# Patient Record
Sex: Female | Born: 1965 | Race: Black or African American | Hispanic: No | Smoking: Never smoker
Health system: Southern US, Community
[De-identification: ages and names within clinical notes are randomized; demographics above are authoritative.]

## PROBLEM LIST (undated history)

## (undated) DIAGNOSIS — E669 Obesity, unspecified: Secondary | ICD-10-CM

## (undated) DIAGNOSIS — I10 Essential (primary) hypertension: Secondary | ICD-10-CM

## (undated) DIAGNOSIS — E119 Type 2 diabetes mellitus without complications: Secondary | ICD-10-CM

## (undated) DIAGNOSIS — K449 Diaphragmatic hernia without obstruction or gangrene: Secondary | ICD-10-CM

## (undated) DIAGNOSIS — I4891 Unspecified atrial fibrillation: Secondary | ICD-10-CM

## (undated) HISTORY — DX: Obesity, unspecified: E66.9

## (undated) HISTORY — DX: Diaphragmatic hernia without obstruction or gangrene: K44.9

## (undated) HISTORY — DX: Unspecified atrial fibrillation: I48.91

---

## 1993-07-12 HISTORY — PX: ABDOMINAL HYSTERECTOMY: SHX81

## 2000-07-12 HISTORY — PX: CHOLECYSTECTOMY: SHX55

## 2003-10-01 ENCOUNTER — Other Ambulatory Visit: Admission: RE | Admit: 2003-10-01 | Discharge: 2003-10-01 | Payer: Self-pay | Admitting: Obstetrics and Gynecology

## 2004-06-09 ENCOUNTER — Ambulatory Visit: Payer: Self-pay | Admitting: Family Medicine

## 2004-06-10 ENCOUNTER — Ambulatory Visit (HOSPITAL_COMMUNITY): Admission: RE | Admit: 2004-06-10 | Discharge: 2004-06-10 | Payer: Self-pay | Admitting: Family Medicine

## 2004-06-11 HISTORY — PX: OTHER SURGICAL HISTORY: SHX169

## 2004-06-28 ENCOUNTER — Ambulatory Visit: Admission: RE | Admit: 2004-06-28 | Discharge: 2004-06-28 | Payer: Self-pay | Admitting: Family Medicine

## 2004-06-28 ENCOUNTER — Ambulatory Visit: Payer: Self-pay | Admitting: Pulmonary Disease

## 2004-07-09 ENCOUNTER — Ambulatory Visit: Payer: Self-pay | Admitting: Family Medicine

## 2004-08-10 ENCOUNTER — Ambulatory Visit: Payer: Self-pay | Admitting: Family Medicine

## 2005-05-28 ENCOUNTER — Ambulatory Visit: Payer: Self-pay | Admitting: Family Medicine

## 2005-05-31 ENCOUNTER — Ambulatory Visit (HOSPITAL_COMMUNITY): Admission: RE | Admit: 2005-05-31 | Discharge: 2005-05-31 | Payer: Self-pay | Admitting: Family Medicine

## 2005-08-04 ENCOUNTER — Ambulatory Visit: Payer: Self-pay | Admitting: Family Medicine

## 2005-10-04 ENCOUNTER — Ambulatory Visit: Payer: Self-pay | Admitting: Family Medicine

## 2005-10-26 ENCOUNTER — Ambulatory Visit: Payer: Self-pay | Admitting: Family Medicine

## 2005-11-05 ENCOUNTER — Ambulatory Visit: Payer: Self-pay | Admitting: Family Medicine

## 2005-11-17 ENCOUNTER — Other Ambulatory Visit: Admission: RE | Admit: 2005-11-17 | Discharge: 2005-11-17 | Payer: Self-pay | Admitting: Family Medicine

## 2005-11-17 ENCOUNTER — Ambulatory Visit: Payer: Self-pay | Admitting: Family Medicine

## 2005-11-17 LAB — CONVERTED CEMR LAB: Pap Smear: NORMAL

## 2006-04-29 ENCOUNTER — Ambulatory Visit: Payer: Self-pay | Admitting: Family Medicine

## 2006-06-14 ENCOUNTER — Ambulatory Visit (HOSPITAL_COMMUNITY): Admission: RE | Admit: 2006-06-14 | Discharge: 2006-06-14 | Payer: Self-pay | Admitting: Family Medicine

## 2006-08-23 ENCOUNTER — Ambulatory Visit: Payer: Self-pay | Admitting: Family Medicine

## 2006-08-25 ENCOUNTER — Encounter: Payer: Self-pay | Admitting: Family Medicine

## 2006-08-25 ENCOUNTER — Encounter (INDEPENDENT_AMBULATORY_CARE_PROVIDER_SITE_OTHER): Payer: Self-pay | Admitting: *Deleted

## 2006-08-25 ENCOUNTER — Other Ambulatory Visit: Admission: RE | Admit: 2006-08-25 | Discharge: 2006-08-25 | Payer: Self-pay | Admitting: General Surgery

## 2006-08-25 LAB — CONVERTED CEMR LAB
Basophils Absolute: 0 10*3/uL (ref 0.0–0.1)
Basophils Relative: 0 % (ref 0–1)
Eosinophils Absolute: 0.1 10*3/uL (ref 0.0–0.7)
Eosinophils Relative: 1 % (ref 0–5)
HCT: 37.7 % (ref 36.0–46.0)
Hemoglobin: 12.1 g/dL (ref 12.0–15.0)
Hgb A1c MFr Bld: 6.9 % — ABNORMAL HIGH (ref 4.6–6.1)
Lymphocytes Relative: 18 % (ref 12–46)
Lymphs Abs: 2.5 10*3/uL (ref 0.7–3.3)
MCHC: 32.1 g/dL (ref 30.0–36.0)
MCV: 82.1 fL (ref 78.0–100.0)
Monocytes Absolute: 1.6 10*3/uL — ABNORMAL HIGH (ref 0.2–0.7)
Monocytes Relative: 12 % — ABNORMAL HIGH (ref 3–11)
Neutro Abs: 9.5 10*3/uL — ABNORMAL HIGH (ref 1.7–7.7)
Neutrophils Relative %: 69 % (ref 43–77)
Platelets: 347 10*3/uL (ref 150–400)
RBC: 4.59 M/uL (ref 3.87–5.11)
RDW: 14.4 % — ABNORMAL HIGH (ref 11.5–14.0)
WBC: 13.7 10*3/uL — ABNORMAL HIGH (ref 4.0–10.5)

## 2006-09-14 ENCOUNTER — Ambulatory Visit: Payer: Self-pay | Admitting: Family Medicine

## 2006-11-28 ENCOUNTER — Encounter: Payer: Self-pay | Admitting: Family Medicine

## 2006-11-28 LAB — CONVERTED CEMR LAB
BUN: 14 mg/dL (ref 6–23)
CO2: 25 meq/L (ref 19–32)
Calcium: 9.6 mg/dL (ref 8.4–10.5)
Chloride: 105 meq/L (ref 96–112)
Cholesterol: 170 mg/dL (ref 0–200)
Creatinine, Ser: 0.82 mg/dL (ref 0.40–1.20)
Glucose, Bld: 103 mg/dL — ABNORMAL HIGH (ref 70–99)
HDL: 51 mg/dL (ref >39)
Hgb A1c MFr Bld: 6.9 % — ABNORMAL HIGH (ref 4.6–6.1)
LDL Cholesterol: 96 mg/dL (ref 0–99)
Potassium: 4.6 meq/L (ref 3.5–5.3)
Sodium: 139 meq/L (ref 135–145)
Total CHOL/HDL Ratio: 3.3
Triglycerides: 117 mg/dL (ref <150)
VLDL: 23 mg/dL (ref 0–40)

## 2006-11-29 ENCOUNTER — Ambulatory Visit: Payer: Self-pay | Admitting: Family Medicine

## 2007-01-26 ENCOUNTER — Encounter: Payer: Self-pay | Admitting: Family Medicine

## 2007-01-26 ENCOUNTER — Ambulatory Visit: Payer: Self-pay | Admitting: Family Medicine

## 2007-01-26 ENCOUNTER — Other Ambulatory Visit: Admission: RE | Admit: 2007-01-26 | Discharge: 2007-01-26 | Payer: Self-pay | Admitting: Family Medicine

## 2007-01-26 LAB — CONVERTED CEMR LAB
Chlamydia, DNA Probe: NEGATIVE
GC Probe Amp, Genital: NEGATIVE

## 2007-01-27 ENCOUNTER — Encounter: Payer: Self-pay | Admitting: Family Medicine

## 2007-01-27 LAB — CONVERTED CEMR LAB
Candida species: NEGATIVE
Gardnerella vaginalis: POSITIVE — AB
Trichomonal Vaginitis: NEGATIVE

## 2007-02-09 ENCOUNTER — Ambulatory Visit: Payer: Self-pay | Admitting: Family Medicine

## 2007-03-23 ENCOUNTER — Emergency Department (HOSPITAL_COMMUNITY): Admission: EM | Admit: 2007-03-23 | Discharge: 2007-03-23 | Payer: Self-pay | Admitting: Emergency Medicine

## 2007-03-29 ENCOUNTER — Ambulatory Visit: Payer: Self-pay | Admitting: Family Medicine

## 2007-04-12 HISTORY — PX: TRANSTHORACIC ECHOCARDIOGRAM: SHX275

## 2007-04-12 HISTORY — PX: NM MYOCAR PERF WALL MOTION: HXRAD629

## 2007-04-24 ENCOUNTER — Ambulatory Visit: Payer: Self-pay | Admitting: Family Medicine

## 2007-04-25 ENCOUNTER — Encounter: Payer: Self-pay | Admitting: Family Medicine

## 2007-04-25 LAB — CONVERTED CEMR LAB
Candida species: NEGATIVE
Chlamydia, DNA Probe: NEGATIVE
GC Probe Amp, Genital: NEGATIVE
Gardnerella vaginalis: POSITIVE — AB
Trichomonal Vaginitis: NEGATIVE

## 2007-06-19 ENCOUNTER — Ambulatory Visit (HOSPITAL_COMMUNITY): Admission: RE | Admit: 2007-06-19 | Discharge: 2007-06-19 | Payer: Self-pay | Admitting: Family Medicine

## 2007-06-19 ENCOUNTER — Encounter: Payer: Self-pay | Admitting: Family Medicine

## 2007-06-19 LAB — CONVERTED CEMR LAB: Pap Smear: NORMAL

## 2008-03-08 ENCOUNTER — Encounter: Payer: Self-pay | Admitting: Family Medicine

## 2008-03-08 DIAGNOSIS — E119 Type 2 diabetes mellitus without complications: Secondary | ICD-10-CM | POA: Insufficient documentation

## 2008-03-08 DIAGNOSIS — I10 Essential (primary) hypertension: Secondary | ICD-10-CM | POA: Insufficient documentation

## 2008-03-12 ENCOUNTER — Ambulatory Visit: Payer: Self-pay | Admitting: Family Medicine

## 2008-03-21 ENCOUNTER — Telehealth: Payer: Self-pay | Admitting: Family Medicine

## 2008-03-25 ENCOUNTER — Telehealth: Payer: Self-pay | Admitting: Family Medicine

## 2008-06-26 ENCOUNTER — Ambulatory Visit (HOSPITAL_COMMUNITY): Admission: RE | Admit: 2008-06-26 | Discharge: 2008-06-26 | Payer: Self-pay | Admitting: Family Medicine

## 2008-08-06 ENCOUNTER — Ambulatory Visit: Payer: Self-pay | Admitting: Family Medicine

## 2008-08-06 LAB — CONVERTED CEMR LAB
Glucose, Bld: 119 mg/dL
Hgb A1c MFr Bld: 7.3 %
Rapid Strep: NEGATIVE

## 2008-09-24 ENCOUNTER — Encounter: Payer: Self-pay | Admitting: Family Medicine

## 2008-09-24 LAB — CONVERTED CEMR LAB
ALT: 15 units/L (ref 0–35)
AST: 12 units/L (ref 0–37)
Albumin: 4.1 g/dL (ref 3.5–5.2)
Alkaline Phosphatase: 66 units/L (ref 39–117)
BUN: 13 mg/dL (ref 6–23)
Basophils Absolute: 0 10*3/uL (ref 0.0–0.1)
Basophils Relative: 0 % (ref 0–1)
Bilirubin, Direct: 0.1 mg/dL (ref 0.0–0.3)
CO2: 26 meq/L (ref 19–32)
Calcium: 9.7 mg/dL (ref 8.4–10.5)
Chloride: 100 meq/L (ref 96–112)
Cholesterol: 188 mg/dL (ref 0–200)
Creatinine, Ser: 0.78 mg/dL (ref 0.40–1.20)
Eosinophils Absolute: 0.3 10*3/uL (ref 0.0–0.7)
Eosinophils Relative: 5 % (ref 0–5)
Glucose, Bld: 141 mg/dL — ABNORMAL HIGH (ref 70–99)
HCT: 40.8 % (ref 36.0–46.0)
HDL: 46 mg/dL (ref >39)
Hemoglobin: 13.1 g/dL (ref 12.0–15.0)
Indirect Bilirubin: 0.3 mg/dL (ref 0.0–0.9)
LDL Cholesterol: 105 mg/dL — ABNORMAL HIGH (ref 0–99)
Lymphocytes Relative: 28 % (ref 12–46)
Lymphs Abs: 1.7 10*3/uL (ref 0.7–4.0)
MCHC: 32.1 g/dL (ref 30.0–36.0)
MCV: 81.4 fL (ref 78.0–100.0)
Monocytes Absolute: 0.6 10*3/uL (ref 0.1–1.0)
Monocytes Relative: 9 % (ref 3–12)
Neutro Abs: 3.6 10*3/uL (ref 1.7–7.7)
Neutrophils Relative %: 58 % (ref 43–77)
Platelets: 359 10*3/uL (ref 150–400)
Potassium: 4.9 meq/L (ref 3.5–5.3)
RBC: 5.01 M/uL (ref 3.87–5.11)
RDW: 14.2 % (ref 11.5–15.5)
Sodium: 136 meq/L (ref 135–145)
TSH: 4.039 microintl units/mL (ref 0.350–4.500)
Total Bilirubin: 0.4 mg/dL (ref 0.3–1.2)
Total CHOL/HDL Ratio: 4.1
Total Protein: 7.8 g/dL (ref 6.0–8.3)
Triglycerides: 185 mg/dL — ABNORMAL HIGH (ref <150)
VLDL: 37 mg/dL (ref 0–40)
WBC: 6.3 10*3/uL (ref 4.0–10.5)

## 2008-10-08 ENCOUNTER — Ambulatory Visit: Payer: Self-pay | Admitting: Family Medicine

## 2008-10-08 ENCOUNTER — Other Ambulatory Visit: Admission: RE | Admit: 2008-10-08 | Discharge: 2008-10-08 | Payer: Self-pay | Admitting: Family Medicine

## 2008-10-08 ENCOUNTER — Encounter: Payer: Self-pay | Admitting: Family Medicine

## 2008-10-08 LAB — CONVERTED CEMR LAB
Blood Glucose, Fasting: 125 mg/dL
OCCULT 1: NEGATIVE

## 2008-10-09 ENCOUNTER — Encounter: Payer: Self-pay | Admitting: Family Medicine

## 2008-10-09 LAB — CONVERTED CEMR LAB
Creatinine, Urine: 96 mg/dL
Microalb Creat Ratio: 5.2 mg/g (ref 0.0–30.0)
Microalb, Ur: 0.5 mg/dL (ref 0.00–1.89)

## 2008-10-13 DIAGNOSIS — E785 Hyperlipidemia, unspecified: Secondary | ICD-10-CM | POA: Insufficient documentation

## 2008-10-31 ENCOUNTER — Ambulatory Visit: Payer: Self-pay | Admitting: Family Medicine

## 2008-10-31 LAB — CONVERTED CEMR LAB
Glucose, Bld: 103 mg/dL
Hgb A1c MFr Bld: 7.2 %

## 2008-11-07 ENCOUNTER — Encounter: Payer: Self-pay | Admitting: Family Medicine

## 2008-11-07 LAB — CONVERTED CEMR LAB
Basophils Absolute: 0 10*3/uL (ref 0.0–0.1)
Basophils Relative: 0 % (ref 0–1)
Eosinophils Absolute: 0.3 10*3/uL (ref 0.0–0.7)
Eosinophils Relative: 5 % (ref 0–5)
HCT: 39.7 % (ref 36.0–46.0)
Hemoglobin: 12.7 g/dL (ref 12.0–15.0)
Lymphocytes Relative: 31 % (ref 12–46)
Lymphs Abs: 2.2 10*3/uL (ref 0.7–4.0)
MCHC: 32 g/dL (ref 30.0–36.0)
MCV: 79.9 fL (ref 78.0–100.0)
Monocytes Absolute: 0.6 10*3/uL (ref 0.1–1.0)
Monocytes Relative: 8 % (ref 3–12)
Neutro Abs: 4.1 10*3/uL (ref 1.7–7.7)
Neutrophils Relative %: 57 % (ref 43–77)
Platelets: 389 10*3/uL (ref 150–400)
RBC: 4.97 M/uL (ref 3.87–5.11)
RDW: 14.1 % (ref 11.5–15.5)
WBC: 7.3 10*3/uL (ref 4.0–10.5)

## 2008-11-12 ENCOUNTER — Encounter: Payer: Self-pay | Admitting: Family Medicine

## 2008-12-06 ENCOUNTER — Ambulatory Visit: Payer: Self-pay | Admitting: Family Medicine

## 2008-12-06 DIAGNOSIS — M25559 Pain in unspecified hip: Secondary | ICD-10-CM

## 2008-12-06 LAB — CONVERTED CEMR LAB: Blood Glucose, Fasting: 138 mg/dL

## 2009-03-07 ENCOUNTER — Ambulatory Visit: Payer: Self-pay | Admitting: Family Medicine

## 2009-03-07 LAB — CONVERTED CEMR LAB
Glucose, Bld: 130 mg/dL
Hgb A1c MFr Bld: 7.2 %

## 2009-06-30 ENCOUNTER — Ambulatory Visit (HOSPITAL_COMMUNITY): Admission: RE | Admit: 2009-06-30 | Discharge: 2009-06-30 | Payer: Self-pay | Admitting: Family Medicine

## 2009-11-13 ENCOUNTER — Telehealth: Payer: Self-pay | Admitting: Family Medicine

## 2009-12-05 ENCOUNTER — Ambulatory Visit: Payer: Self-pay | Admitting: Family Medicine

## 2009-12-05 ENCOUNTER — Other Ambulatory Visit
Admission: RE | Admit: 2009-12-05 | Discharge: 2009-12-05 | Payer: Self-pay | Source: Home / Self Care | Admitting: Family Medicine

## 2009-12-05 DIAGNOSIS — R5381 Other malaise: Secondary | ICD-10-CM

## 2009-12-05 DIAGNOSIS — L255 Unspecified contact dermatitis due to plants, except food: Secondary | ICD-10-CM | POA: Insufficient documentation

## 2009-12-05 DIAGNOSIS — R5383 Other fatigue: Secondary | ICD-10-CM

## 2009-12-05 DIAGNOSIS — N760 Acute vaginitis: Secondary | ICD-10-CM | POA: Insufficient documentation

## 2009-12-05 LAB — CONVERTED CEMR LAB
Blood Glucose, Fasting: 114 mg/dL
OCCULT 1: NEGATIVE
Pap Smear: NEGATIVE

## 2009-12-06 ENCOUNTER — Encounter: Payer: Self-pay | Admitting: Family Medicine

## 2009-12-06 LAB — CONVERTED CEMR LAB
Chlamydia, DNA Probe: NEGATIVE
GC Probe Amp, Genital: NEGATIVE

## 2009-12-10 ENCOUNTER — Encounter: Payer: Self-pay | Admitting: Family Medicine

## 2009-12-12 LAB — CONVERTED CEMR LAB
Candida species: NEGATIVE
Gardnerella vaginalis: POSITIVE — AB

## 2009-12-17 LAB — CONVERTED CEMR LAB
ALT: 10 units/L (ref 0–35)
AST: 11 units/L (ref 0–37)
Albumin: 4.1 g/dL (ref 3.5–5.2)
Alkaline Phosphatase: 70 units/L (ref 39–117)
BUN: 11 mg/dL (ref 6–23)
Basophils Absolute: 0 10*3/uL (ref 0.0–0.1)
Basophils Relative: 0 % (ref 0–1)
Bilirubin, Direct: <0.1 mg/dL (ref 0.0–0.3)
CO2: 25 meq/L (ref 19–32)
Calcium: 9.7 mg/dL (ref 8.4–10.5)
Chloride: 103 meq/L (ref 96–112)
Cholesterol: 196 mg/dL (ref 0–200)
Creatinine, Ser: 0.79 mg/dL (ref 0.40–1.20)
Creatinine, Urine: 154.8 mg/dL
Eosinophils Absolute: 0.4 10*3/uL (ref 0.0–0.7)
Eosinophils Relative: 5 % (ref 0–5)
Glucose, Bld: 111 mg/dL — ABNORMAL HIGH (ref 70–99)
HCT: 39.5 % (ref 36.0–46.0)
HDL: 53 mg/dL (ref >39)
Hemoglobin: 13 g/dL (ref 12.0–15.0)
Hgb A1c MFr Bld: 6.8 % — ABNORMAL HIGH (ref <5.7)
LDL Cholesterol: 112 mg/dL — ABNORMAL HIGH (ref 0–99)
Lymphocytes Relative: 27 % (ref 12–46)
Lymphs Abs: 1.9 10*3/uL (ref 0.7–4.0)
MCHC: 32.9 g/dL (ref 30.0–36.0)
MCV: 80.4 fL (ref 78.0–100.0)
Microalb Creat Ratio: 6.5 mg/g (ref 0.0–30.0)
Microalb, Ur: 1 mg/dL (ref 0.00–1.89)
Monocytes Absolute: 0.6 10*3/uL (ref 0.1–1.0)
Monocytes Relative: 8 % (ref 3–12)
Neutro Abs: 4.1 10*3/uL (ref 1.7–7.7)
Neutrophils Relative %: 59 % (ref 43–77)
Platelets: 393 10*3/uL (ref 150–400)
Potassium: 4.4 meq/L (ref 3.5–5.3)
RBC: 4.91 M/uL (ref 3.87–5.11)
RDW: 14 % (ref 11.5–15.5)
Sodium: 139 meq/L (ref 135–145)
TSH: 3.808 microintl units/mL (ref 0.350–4.500)
Total Bilirubin: 0.4 mg/dL (ref 0.3–1.2)
Total CHOL/HDL Ratio: 3.7
Total Protein: 7.9 g/dL (ref 6.0–8.3)
Triglycerides: 154 mg/dL — ABNORMAL HIGH (ref <150)
VLDL: 31 mg/dL (ref 0–40)
Vit D, 25-Hydroxy: 16 ng/mL — ABNORMAL LOW (ref 30–89)
WBC: 6.9 10*3/uL (ref 4.0–10.5)

## 2010-04-13 ENCOUNTER — Ambulatory Visit: Payer: Self-pay | Admitting: Family Medicine

## 2010-04-13 DIAGNOSIS — K120 Recurrent oral aphthae: Secondary | ICD-10-CM

## 2010-04-15 ENCOUNTER — Encounter: Payer: Self-pay | Admitting: Family Medicine

## 2010-04-27 LAB — CONVERTED CEMR LAB
ALT: 13 units/L (ref 0–35)
AST: 13 units/L (ref 0–37)
Albumin: 4.2 g/dL (ref 3.5–5.2)
Alkaline Phosphatase: 64 units/L (ref 39–117)
BUN: 8 mg/dL (ref 6–23)
Bilirubin, Direct: 0.1 mg/dL (ref 0.0–0.3)
CO2: 26 meq/L (ref 19–32)
Calcium: 10.5 mg/dL (ref 8.4–10.5)
Chloride: 105 meq/L (ref 96–112)
Cholesterol: 214 mg/dL — ABNORMAL HIGH (ref 0–200)
Creatinine, Ser: 0.75 mg/dL (ref 0.40–1.20)
Glucose, Bld: 134 mg/dL — ABNORMAL HIGH (ref 70–99)
HDL: 50 mg/dL (ref >39)
Hgb A1c MFr Bld: 6.9 % — ABNORMAL HIGH (ref <5.7)
Indirect Bilirubin: 0.2 mg/dL (ref 0.0–0.9)
LDL Cholesterol: 141 mg/dL — ABNORMAL HIGH (ref 0–99)
Potassium: 4.7 meq/L (ref 3.5–5.3)
Sodium: 137 meq/L (ref 135–145)
Total Bilirubin: 0.3 mg/dL (ref 0.3–1.2)
Total CHOL/HDL Ratio: 4.3
Total Protein: 7.8 g/dL (ref 6.0–8.3)
Triglycerides: 113 mg/dL (ref <150)
VLDL: 23 mg/dL (ref 0–40)

## 2010-04-30 ENCOUNTER — Ambulatory Visit: Payer: Self-pay | Admitting: Physician Assistant

## 2010-04-30 DIAGNOSIS — L519 Erythema multiforme, unspecified: Secondary | ICD-10-CM

## 2010-06-16 ENCOUNTER — Encounter: Payer: Self-pay | Admitting: Family Medicine

## 2010-07-02 ENCOUNTER — Ambulatory Visit (HOSPITAL_COMMUNITY)
Admission: RE | Admit: 2010-07-02 | Discharge: 2010-07-02 | Payer: Self-pay | Source: Home / Self Care | Attending: Family Medicine | Admitting: Family Medicine

## 2010-07-03 ENCOUNTER — Encounter: Payer: Self-pay | Admitting: Family Medicine

## 2010-08-09 LAB — CONVERTED CEMR LAB
Glucose, Bld: 185 mg/dL
Hgb A1c MFr Bld: 7.3 %

## 2010-08-11 NOTE — Letter (Signed)
[facility]: [facility]   Imported By: Lind Guest 04/17/2010 09:04:23  _____________________________________________________________________  External Attachment:    Type:   Image     Comment:   External Document

## 2010-08-11 NOTE — Letter (Signed)
Summary: 1st missed letter  1st missed letter   Imported By: Lind Guest 06/16/2010 16:51:49  _____________________________________________________________________  External Attachment:    Type:   Image     Comment:   External Document

## 2010-08-11 NOTE — Assessment & Plan Note (Signed)
Summary: earache   Vital Signs:  Patient profile:   45 year old female Menstrual status:  hysterectomy Height:      63.5 inches Weight:      275.50 pounds BMI:     48.21 O2 Sat:      98 % Pulse rate:   91 / minute Pulse rhythm:   regular Resp:     16 per minute BP sitting:   150 / 100  (left arm) Cuff size:   xl  Vitals Entered By: Everitt Amber LPN (April 13, 2010 10:53 AM)  Nutrition Counseling: Patient's BMI is greater than 25 and therefore counseled on weight management options. CC: has had a right earache since Friday, sore throat on right side Pain Assessment Patient in pain? yes     Location: right ear Intensity: 10 Type: aching Onset of pain  Friday   CC:  has had a right earache since Friday and sore throat on right side.  History of Present Illness: Reports  that she has been  doing well. Denies recent fever or chills. Denies sinus pressure ornasal congestion ,she does have recent  ear pain and tender neck glands, also c/o sore on her tongue. Denies chest congestion, or cough productive of sputum. Denies chest pain, palpitations, PND, orthopnea or leg swelling. Denies abdominal pain, nausea, vomitting, diarrhea or constipation. Denies change in bowel movements or bloody stool. Denies dysuria , frequency, incontinence or hesitancy. Denies  joint pain, swelling, or reduced mobility. Denies headaches, vertigo, seizures. Denies depression, anxiety or insomnia.Reports marked improvement in her mental health. She has gained weight , has not been exercising as before and intends to change this. Denies  rash, lesions, or itch.     Current Medications (verified): 1)  Dicloxacillin Sodium 500 Mg Caps (Dicloxacillin Sodium) .... One Cap Every 6 Hrs 2)  Betamethasone Dipropionate 0.05 % Crea (Betamethasone Dipropionate) .... Uad 3)  Lotensin Hct 20-25 Mg Tabs (Benazepril-Hydrochlorothiazide) .... Take 1 Tablet By Mouth Once A Day 4)  Fluconazole 150 Mg Tabs  (Fluconazole) .... Take 1 Tablet By Mouth Once A Day 5)  Accuchek Aviva Lancets and Strips .... For Once Daily Testing 6)  Metronidazole 500 Mg Tabs (Metronidazole) .... Take 1 Tablet By Mouth Two Times A Day  Allergies (verified): No Known Drug Allergies  Review of Systems      See HPI General:  Complains of fatigue. ENT:  Complains of earache; right earache  x 4 days, no drainage from ear or fever, also apthous ulcer on right side of tongue,lower aspect. Endo:  Denies excessive thirst and excessive urination. Heme:  Denies abnormal bruising and bleeding. Allergy:  Denies hives or rash and itching eyes.  Physical Exam  General:  Well-developed,obesein no acute distress; alert,appropriate and cooperative throughout examination HEENT: No facial asymmetry,  EOMI, No sinus tenderness, TM's clear, right external ear canal swollen and erythematous, oropharynx  pink and moist.right anterior cervical adenitis   Chest: Clear to auscultation bilaterally.  CVS: S1, S2, No murmurs, No S3.   Abd: Soft, Nontender.  MS: Adequate ROM spine, hips, shoulders and knees.  Ext: No edema.   CNS: CN 2-12 intact, power tone and sensation normal throughout.   Skin: Intact,apthous ulcer on lower right tongue Psych: Good eye contact, normal affect.  Memory intact, not anxious or depressed appearing.    Impression & Recommendations:  Problem # 1:  APHTHOUS ULCERS (ICD-528.2) Assessment New pt to use canbker sore prep topically  Problem # 2:  OTITIS EXTERNA,  ACUTE, RIGHT (ICD-380.12) Assessment: Comment Only septra presscribed  Problem # 3:  HYPERTENSION (ICD-401.9) Assessment: Deteriorated  Her updated medication list for this problem includes:    Lotensin Hct 20-25 Mg Tabs (Benazepril-hydrochlorothiazide) .Marland Kitchen... Take 1 tablet by mouth once a day med compliance stressed, out of meds for days Orders: T-Basic Metabolic Panel [phone] [phone])  BP today: 150/100 Prior BP: 140/94 (12/05/2009)  Labs  Reviewed: K+: 4.4 (12/05/2009) Creat: : 0.79 (12/05/2009)   Chol: 196 (12/05/2009)   HDL: 53 (12/05/2009)   LDL: 112 (12/05/2009)   TG: 154 (12/05/2009)  Problem # 4:  DIABETES MELLITUS (ICD-250.00) Assessment: Comment Only  Her updated medication list for this problem includes:    Lotensin Hct 20-25 Mg Tabs (Benazepril-hydrochlorothiazide) .Marland Kitchen... Take 1 tablet by mouth once a day    Metformin Hcl 500 Mg Tabs (Metformin hcl) .Marland Kitchen... Take 1 tablet by mouth once a day  Orders: T- Hemoglobin A1C ([id number]), pastr due  Labs Reviewed: Creat: 0.79 (12/05/2009)     Last Eye Exam: normal (10/08/2008) Reviewed HgBA1c results: 6.8 (12/05/2009)  7.2 (03/07/2009)  Complete Medication List: 1)  Dicloxacillin Sodium 500 Mg Caps (Dicloxacillin sodium) .... One cap every 6 hrs 2)  Betamethasone Dipropionate 0.05 % Crea (Betamethasone dipropionate) .... Uad 3)  Lotensin Hct 20-25 Mg Tabs (Benazepril-hydrochlorothiazide) .... Take 1 tablet by mouth once a day 4)  Fluconazole 150 Mg Tabs (Fluconazole) .... Take 1 tablet by mouth once a day 5)  Accuchek Aviva Lancets and Strips  .... For once daily testing 6)  Metformin Hcl 500 Mg Tabs (Metformin hcl) .... Take 1 tablet by mouth once a day 7)  Septra Ds 800-160 Mg Tabs (Sulfamethoxazole-trimethoprim) .... Take 1 tablet by mouth two times a day 8)  Fluconazole 150 Mg Tabs (Fluconazole) .... Take 1 tablet by mouth once a day as needed 9)  Lovastatin 40 Mg Tabs (Lovastatin) .... Take 1 tab by mouth at bedtime  Other Orders: T-Hepatic Function [id number]) T-Lipid Profile [id number])  Patient Instructions: 1)  Please schedule a follow-up appointment in 2 months. 2)  It is important that you exercise regularly at least 20 minutes 5 times a week. If you develop chest pain, have severe difficulty breathing, or feel very tired , stop exercising immediately and seek medical attention. 3)  You need to lose weight. Consider a lower calorie diet and  regular exercise.  4)  BMP prior to visit, ICD-9: 5)  Hepatic Panel prior to visit, ICD-9: 6)  Lipid Panel prior to visit, ICD-9:  afsting today 7)  HbgA1C prior to visit, ICD-9: Prescriptions: LOVASTATIN 40 MG TABS (LOVASTATIN) Take 1 tab by mouth at bedtime  #90 x 1   Entered and Authorized by:   Syliva Overman MD   Signed by:   Syliva Overman MD on 04/13/2010   Method used:   Electronically to        [facility]  [address] (retail)       [address]       [place], [place]  [address]       Ph: [phone]       Fax: [phone]   RxID:   [id number] LOTENSIN HCT 20-25 MG TABS (BENAZEPRIL-HYDROCHLOROTHIAZIDE) Take 1 tablet by mouth once a day  #30 x 3   Entered by:   Everitt Amber LPN   Authorized by:   Syliva Overman MD   Signed by:   Everitt Amber LPN on 18/84/1660   Method used:  Electronically to        [facility]  [address] (retail)       [address]       [place], [place]  [address]       Ph: [phone]       Fax: [phone]   RxID:   [id number] FLUCONAZOLE 150 MG TABS (FLUCONAZOLE) Take 1 tablet by mouth once a day as needed  #3 x 0   Entered and Authorized by:   Syliva Overman MD   Signed by:   Syliva Overman MD on 04/13/2010   Method used:   Electronically to        [facility]  [address] (retail)       [address]       [place], [place]  [address]       Ph: [phone]       Fax: [phone]   RxID:   [id number] SEPTRA DS 800-160 MG TABS (SULFAMETHOXAZOLE-TRIMETHOPRIM) Take 1 tablet by mouth two times a day  #20 x 0   Entered and Authorized by:   Syliva Overman MD   Signed by:   Syliva Overman MD on 04/13/2010   Method used:   Electronically to        [facility]  [address] (retail)       [address]       [place], [place]  [address]       Ph: [phone]       Fax: [phone]   RxID:   [id number] METFORMIN HCL 500 MG TABS (METFORMIN HCL) Take 1 tablet by  mouth once a day  #90 x 1   Entered and Authorized by:   Syliva Overman MD   Signed by:   Syliva Overman MD on 04/13/2010   Method used:   Printed then faxed to ...       [facility]  [address] (retail)       [address]       [place], [place]  [address]       Ph: [phone]       Fax: [phone]   RxID:   [id number]

## 2010-08-11 NOTE — Progress Notes (Signed)
Summary: BITE AND MEDICATION  Phone Note Call from Patient   Summary of Call:  SHE WENT TO URGENT CARE AND THEY THOUGHT IT WAS SPIDER BITE AND GAVE  HER DICLOXACILL 500 MG 4 X A DAY WANTS TO KNOW IF THIS IS TO [facility]  CALL BACK AT [phone] Initial call taken by: Lind Guest,  Nov 13, 2009 10:51 AM  Follow-up for Phone Call        pls let her know that ido not see this med as lwritten, the pharmacist should be able toverify or the doc who wrote it, I am not sure exactly what med is being asked about, aslo she nmeeds to come to the office, pls make aoppt has not been in for a lonng time Follow-up by: Syliva Overman MD,  Nov 13, 2009 4:40 PM  Additional Follow-up for Phone Call Additional follow up Details #1::        Patient aware Additional Follow-up by: Everitt Amber LPN,  Nov 15, 8467 1:46 PM

## 2010-08-11 NOTE — Letter (Signed)
Summary: Letter  Letter   Imported By: Lind Guest 12/10/2009 14:20:14  _____________________________________________________________________  External Attachment:    Type:   Image     Comment:   External Document
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
## 2010-08-11 NOTE — Assessment & Plan Note (Signed)
Summary: KEEPS HAVING BREAK OUTS ON FACE AND HURTS/SLJ   Vital Signs:  Patient profile:   45 year old female Menstrual status:  hysterectomy Height:      63.5 inches Weight:      271.75 pounds BMI:     47.55 O2 Sat:      98 % on Room air Pulse rate:   76 / minute Resp:     16 per minute BP sitting:   142 / 92  (right arm)  Vitals Entered By: Louie Casa CMA (April 30, 2010 9:56 AM) CC: Having painful lip and face swelling. Spot on right arm that is also painful   CC:  Having painful lip and face swelling. Spot on right arm that is also painful.  History of Present Illness: 3rd episode of painful, itchy rash.  Had an episode last fall, one summer 2010 and started again yesterday.  Did see derm once regarding some dark spots that were left over after an episode and was prescribed a cream to use. States yesterday developed itching and discomfort face, Rt upper eyelid, and around nose and mouth.  Developed swelling of her lip and red spots.  Today has a tender red spot on her Rt forearm.  Has noticed since arrival to office that she now has one on her thigh too.  No new meds, lotions, etc.     Current Medications (verified): 1)  Lotensin Hct 20-25 Mg Tabs (Benazepril-Hydrochlorothiazide) .... Take 1 Tablet By Mouth Once A Day 2)  Fluconazole 150 Mg Tabs (Fluconazole) .... Take 1 Tablet By Mouth Once A Day 3)  Accuchek Aviva Lancets and Strips .... For Once Daily Testing 4)  Metformin Hcl 500 Mg Tabs (Metformin Hcl) .... Take 1 Tablet By Mouth Once A Day 5)  Lovastatin 40 Mg Tabs (Lovastatin) .... Take 1 Tab By Mouth At Bedtime  Allergies (verified): No Known Drug Allergies  Past History:  Past medical history reviewed for relevance to current acute and chronic problems.  Past Medical History: Reviewed history from 03/08/2008 and no changes required. Current Problems:  OBESITY (ICD-278.00) HYPERTENSION (ICD-401.9) DIABETES MELLITUS (ICD-250.00) CANDIDIASIS OF VULVA AND  VAGINA (ICD-112.1) CELLULITIS AND ABSCESS OF FACE (ICD-682.0)  Review of Systems General:  Denies chills and fever. ENT:  Denies earache, nasal congestion, sinus pressure, and sore throat. CV:  Denies chest pain or discomfort and palpitations. Resp:  Denies cough and shortness of breath. GI:  Denies abdominal pain, loss of appetite, nausea, and vomiting; denies dysphagia. Allergy:  Denies sneezing.  Physical Exam  General:  Well-developed,well-nourished,in no acute distress; alert,appropriate and cooperative throughout examination Head:  Normocephalic and atraumatic without obvious abnormalities. No apparent alopecia or balding. Ears:  External ear exam shows no significant lesions or deformities.  Otoscopic examination reveals clear canals, tympanic membranes are intact bilaterally without bulging, retraction, inflammation or discharge. Hearing is grossly normal bilaterally. Nose:  External nasal examination shows no deformity or inflammation. Nasal mucosa are pink and moist without lesions or exudates. Mouth:  Oral mucosa and oropharynx without lesions or exudates.  Teeth in good repair. mild swelling of Rt upper lip noted. Neck:  No deformities, masses, or tenderness noted. Lungs:  Normal respiratory effort, chest expands symmetrically. Lungs are clear to auscultation, no crackles or wheezes. Heart:  Normal rate and regular rhythm. S1 and S2 normal without gallop, murmur, click, rub or other extra sounds. Skin:  erythmatous dime sized macular lesion noted Rt chin, Rt dorsomedial forarm quarter sized tender annular target lesion, dusty  center with mildly erythematous border Cervical Nodes:  No lymphadenopathy noted Psych:  Cognition and judgment appear intact. Alert and cooperative with normal attention span and concentration. 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  Complete Medication List: 1)  Lotensin Hct 20-25 Mg Tabs (Benazepril-hydrochlorothiazide) .... Take 1 tablet by mouth once a day 2)  Accuchek Aviva Lancets and Strips  .... For once daily testing 3)  Metformin Hcl 500 Mg Tabs (Metformin hcl) .... Take 1 tablet by mouth once a day 4)  Lovastatin 40 Mg Tabs (Lovastatin) .... Take 1 tab by mouth at bedtime  Patient Instructions: 1)  Please schedule a follow-up appointment as needed. 2)  I suspect that you have Erythema Multiforme.  I have arranged for you to see the dermatologist today at 12:00 Noon for evaluation.   Orders Added: 1)  Est. Patient Level II [28768]

## 2010-08-11 NOTE — Letter (Signed)
Summary: Letter  Letter   Imported By: Lind Guest 04/15/2010 13:51:15  _____________________________________________________________________  External Attachment:    Type:   Image     Comment:   External Document

## 2010-08-13 NOTE — Letter (Signed)
Summary: medical release  medical release   Imported By: Lind Guest 07/03/2010 08:35:59  _____________________________________________________________________  External Attachment:    Type:   Image     Comment:   External Document

## 2010-11-27 NOTE — Procedures (Signed)
[name], Toni Olsen NO.:  [id number]   MEDICAL RECORD NO.:  [id number]          PATIENT TYPE:  OUT   LOCATION:  SLEEP LAB                     FACILITY:  [facility]   PHYSICIAN:  Marcelyn Bruins, M.D. [facility] DATE OF BIRTH:  1966/04/18   DATE OF STUDY:  06/28/2004                              NOCTURNAL POLYSOMNOGRAM   REFERRING PHYSICIAN:  Dr. Milus Mallick. [place].   INDICATION FOR THE STUDY:  Hypersomnia with sleep apnea.   SLEEP ARCHITECTURE:  The patient had total sleep time of 290 minutes with  very decreased REM and slow-wave sleep. Sleep onset latency was normal,  however, REM onset was prolonged.   IMPRESSION:  1.  Split night study reveals mild obstructive sleep apnea/hypopnea syndrome      with 33 obstructive events being noted in the first 131 minutes of      sleep. This gave the patient a respiratory disturbance index of 15      events per hour and there was an isolated O2 desaturation to 74%. Events      were not positional. By protocol, the patient was then placed on a      medium small ResMed mask and continuous positive airway pressure was      initiated. At a level of 9 cm, there was excellent control with the      events and snoring. It should be noted, however, the patient had little      to no supine sleep during the continuous positive airway pressure      titration. Prior to continuous positive airway pressure initiation,      there was intermittent very loud snoring.  2.  No clinically significant cardiac arrhythmias, however, the patient did      have small numbers of the premature atrial contractions and premature      ventricular contractions.  3.  Small numbers of leg jerks with no significant sleep disruption.  4.  Because of the mild nature of the patient's obstructive apnea, treatment      options include weight loss, continuous positive airway pressure, upper      airway surgery, or possibly oral appliance. Clinical correlation is  suggested based on symptomatology and lifestyle.      KC/MEDQ  D:  07/16/2004 10:50:26  T:  07/16/2004 11:30:00  Job:  [id number]

## 2011-04-23 LAB — CBC
HCT: 37.6
Hemoglobin: 12.4
MCHC: 33
MCV: 79.8
Platelets: 343
RBC: 4.71
RDW: 14.1 — ABNORMAL HIGH
WBC: 8.2

## 2011-04-23 LAB — DIFFERENTIAL
Basophils Absolute: 0
Basophils Relative: 0
Eosinophils Absolute: 0.4
Eosinophils Relative: 5
Lymphocytes Relative: 26
Lymphs Abs: 2.1
Monocytes Absolute: 0.7
Monocytes Relative: 9
Neutro Abs: 5
Neutrophils Relative %: 60

## 2011-04-23 LAB — BASIC METABOLIC PANEL
BUN: 11
CO2: 27
Calcium: 9.5
Chloride: 103
Creatinine, Ser: 0.88
GFR calc Af Amer: >60
GFR calc non Af Amer: >60
Glucose, Bld: 129 — ABNORMAL HIGH
Potassium: 3.6
Sodium: 137

## 2012-07-30 ENCOUNTER — Encounter (HOSPITAL_COMMUNITY): Payer: Self-pay | Admitting: *Deleted

## 2012-07-30 ENCOUNTER — Emergency Department (HOSPITAL_COMMUNITY)
Admission: EM | Admit: 2012-07-30 | Discharge: 2012-07-30 | Disposition: A | Discharge status: Home / Self Care | Payer: Self-pay | Attending: Emergency Medicine | Admitting: Emergency Medicine

## 2012-07-30 DIAGNOSIS — Z9089 Acquired absence of other organs: Secondary | ICD-10-CM | POA: Insufficient documentation

## 2012-07-30 DIAGNOSIS — I480 Paroxysmal atrial fibrillation: Secondary | ICD-10-CM

## 2012-07-30 DIAGNOSIS — I10 Essential (primary) hypertension: Secondary | ICD-10-CM | POA: Insufficient documentation

## 2012-07-30 DIAGNOSIS — R0602 Shortness of breath: Secondary | ICD-10-CM | POA: Insufficient documentation

## 2012-07-30 DIAGNOSIS — R42 Dizziness and giddiness: Secondary | ICD-10-CM | POA: Insufficient documentation

## 2012-07-30 DIAGNOSIS — Z90710 Acquired absence of both cervix and uterus: Secondary | ICD-10-CM | POA: Insufficient documentation

## 2012-07-30 DIAGNOSIS — E119 Type 2 diabetes mellitus without complications: Secondary | ICD-10-CM | POA: Insufficient documentation

## 2012-07-30 DIAGNOSIS — I4891 Unspecified atrial fibrillation: Secondary | ICD-10-CM | POA: Insufficient documentation

## 2012-07-30 HISTORY — DX: Type 2 diabetes mellitus without complications: E11.9

## 2012-07-30 HISTORY — DX: Essential (primary) hypertension: I10

## 2012-07-30 LAB — CBC WITH DIFFERENTIAL/PLATELET
Basophils Relative: 0 % (ref 0–1)
HCT: 40.4 % (ref 36.0–46.0)
Hemoglobin: 13.8 g/dL (ref 12.0–15.0)
Lymphs Abs: 2.5 10*3/uL (ref 0.7–4.0)
MCH: 27.7 pg (ref 26.0–34.0)
MCHC: 34.2 g/dL (ref 30.0–36.0)
Monocytes Absolute: 0.4 10*3/uL (ref 0.1–1.0)
Monocytes Relative: 5 % (ref 3–12)
Neutro Abs: 3.7 10*3/uL (ref 1.7–7.7)
Neutrophils Relative %: 53 % (ref 43–77)
RBC: 4.98 MIL/uL (ref 3.87–5.11)

## 2012-07-30 LAB — BASIC METABOLIC PANEL
BUN: 10 mg/dL (ref 6–23)
Chloride: 101 mEq/L (ref 96–112)
GFR calc Af Amer: >90 mL/min (ref >90)
Glucose, Bld: 175 mg/dL — ABNORMAL HIGH (ref 70–99)
Potassium: 3.8 mEq/L (ref 3.5–5.1)
Sodium: 137 mEq/L (ref 135–145)

## 2012-07-30 MED ORDER — DILTIAZEM HCL 25 MG/5ML IV SOLN
10.0000 mg | Freq: Once | INTRAVENOUS | Status: AC
  Administered 2012-07-30: 10 mg via INTRAVENOUS

## 2012-07-30 MED ORDER — ADENOSINE 6 MG/2ML IV SOLN
6.0000 mg | Freq: Once | INTRAVENOUS | Status: AC
  Administered 2012-07-30: 6 mg via INTRAVENOUS

## 2012-07-30 MED ORDER — DILTIAZEM HCL 100 MG IV SOLR
5.0000 mg/h | Freq: Once | INTRAVENOUS | Status: AC
  Administered 2012-07-30: 5 mg/h via INTRAVENOUS
  Filled 2012-07-30: qty 100

## 2012-07-30 MED ORDER — DILTIAZEM HCL 25 MG/5ML IV SOLN
INTRAVENOUS | Status: AC
  Filled 2012-07-30: qty 10

## 2012-07-30 MED ORDER — ADENOSINE 6 MG/2ML IV SOLN
INTRAVENOUS | Status: AC
  Administered 2012-07-30: 12 mg
  Filled 2012-07-30: qty 6

## 2012-07-30 MED ORDER — ASPIRIN EC 325 MG PO TBEC: 325.0000 mg | DELAYED_RELEASE_TABLET | Freq: Every day | ORAL | Status: DC

## 2012-07-30 MED ORDER — METOPROLOL SUCCINATE 12.5 MG HALF TABLET: 12.5000 mg | ORAL_TABLET | Freq: Every day | ORAL | Status: DC

## 2012-07-30 NOTE — ED Provider Notes (Signed)
History  This chart was scribed for Toni Gaskins, MD by Erskine Emery, ED Scribe. This patient was seen in room APA16A/APA16A and the patient's care was started at 08:58.   CSN: [id number]  Arrival date & time 07/30/12  0844   None     Chief Complaint  Patient presents with  . Tachycardia     The history is provided by the patient. No language interpreter was used.  Toni Olsen is a 47 y.o. female who presents to the Emergency Department complaining of tachycardia that woke her up this morning. Pt reports some associated SOB, dizziness, and light headedness, but denies any chest pain, difficulty breathing, LOC, headache, vision changes, abdominal pain, back pain, emesis, or diarrhea. Pt reports previous episodes of similar symptoms but never this bad and never with lightheadedness.  Nothing improves her symptoms Pt has never seen a physician for this and has never been diagnosed with an irregular heart beat. Pt reports she felt fine going to bed last night. Pt is on metformin for DM as well as medication for her blood pressure and a fluid pill. She denies taking any drugs that might irritate her heart. She does not have periods anymore because of a h/o partial hysterectomy. She also has a h/o cholecystectomy. She is allergic to penicilin and sulfa antibiotics.   Pt's PCP is in [place].  [facility] - HTN and Diabetes  No past surgical history on file.  No family history on file.  History  Substance Use Topics  . Smoking status: Not on file  . Smokeless tobacco: Not on file  . Alcohol Use: Not on file    OB History    No data available      Review of Systems  Eyes: Negative for visual disturbance.  Respiratory: Positive for shortness of breath.   Cardiovascular: Negative for chest pain.       Tachycardia  Gastrointestinal: Negative for nausea, vomiting, abdominal pain and diarrhea.  Musculoskeletal: Negative for back pain.  Neurological: Positive for dizziness and  light-headedness. Negative for syncope and headaches.  All other systems reviewed and are negative.    Allergies  Review of patient's allergies indicates not on file.  Home Medications  No current outpatient prescriptions on file.  BP 164/104  Pulse 116  Temp 97.8 F (36.6 C) (Oral)  Resp 20  SpO2 99% BP 116/69  Pulse 81  Temp 97.8 F (36.6 C) (Oral)  Resp 17  SpO2 100%   Physical Exam CONSTITUTIONAL: Well developed/well nourished, mildly anxious HEAD AND FACE: Normocephalic/atraumatic EYES: EOMI/PERRL ENMT: Mucous membranes moist NECK: supple no meningeal signs SPINE:entire spine nontender CV: Tachycardia, irregular, there are no loud murmurs LUNGS: Lungs are clear to auscultation bilaterally, no apparent distress ABDOMEN: soft, nontender, no rebound or guarding GU:no cva tenderness NEURO: Pt is awake/alert, moves all extremitiesx4 EXTREMITIES: pulses normal, full ROM SKIN: warm, color normal PSYCH: anxious   ED Course  Procedures  CRITICAL CARE Performed by: Toni Olsen   Total critical care time: 37  Critical care time was exclusive of separately billable procedures and treating other patients.  Critical care was necessary to treat or prevent imminent or life-threatening deterioration.  Critical care was time spent personally by me on the following activities: development of treatment plan with patient and/or surrogate as well as nursing, discussions with consultants, evaluation of patient's response to treatment, examination of patient, obtaining history from patient or surrogate, ordering and performing treatments and interventions, ordering and review of laboratory studies,  ordering and review of radiographic studies, pulse oximetry and re-evaluation of patient's condition.   CHEMICAL CARDIOVERSION - PROCEDURE NOTE INDICATIONS - SVT DISCUSSED RISK/BENEFITS OF USING ADENOSINE FOR SVT - PT AGREED ADENOSINE 6MG  AND ADENOSINE 12MG  USED TO TERMINATE  SVT WITHOUT SUCCESS PT TOLERATED PROCEDURE WITHOUT ANY COMPLICATIONS    DIAGNOSTIC STUDIES: Oxygen Saturation is 100% on [place], normal by my interpretation.    COORDINATION OF CARE: 08:58--I evaluated the patient and we discussed a treatment plan including EKG, blood work, and adenosine  to which the pt agreed.    9:44 AM Initial rhythm appeared to be SVT.  Adenosine x2 given but no response in heart rate.  cardizem given to patient, and with slowing of heart rate she now appears to be in afib with RVR.  cardizem gtt now running and she received two IV boluses of cardizem.  Pt is reporting improvement.  She is currently in no distress 11:08 AM Pt improved but still with intermittent tachycardia 12:52 PM Repeat EKG shows sinus rhythm  13:36--Consult. D/w dr Rennis Golden with cardiology.  Pt does not have cardiologist.  We discussed case (likely paroxysmal afib now in sinus rhythm) Recommends starting ASA and toprol xl 12.5mg  daily.   Pt to f/u in one week Pt agreeable with plan  Results for orders placed during the hospital encounter of 07/30/12  BASIC METABOLIC PANEL      Component Value Range   Sodium 137  135 - 145 mEq/L   Potassium 3.8  3.5 - 5.1 mEq/L   Chloride 101  96 - 112 mEq/L   CO2 24  19 - 32 mEq/L   Glucose, Bld 175 (*) 70 - 99 mg/dL   BUN 10  6 - 23 mg/dL   Creatinine, Ser 4.09  0.50 - 1.10 mg/dL   Calcium 81.1  8.4 - 91.4 mg/dL   GFR calc non Af Amer >90  >90 mL/min   GFR calc Af Amer >90  >90 mL/min  CBC WITH DIFFERENTIAL      Component Value Range   WBC 6.9  4.0 - 10.5 K/uL   RBC 4.98  3.87 - 5.11 MIL/uL   Hemoglobin 13.8  12.0 - 15.0 g/dL   HCT 78.2  95.6 - 21.3 %   MCV 81.1  78.0 - 100.0 fL   MCH 27.7  26.0 - 34.0 pg   MCHC 34.2  30.0 - 36.0 g/dL   RDW 08.6  57.8 - 46.9 %   Platelets 381  150 - 400 K/uL   Neutrophils Relative 53  43 - 77 %   Neutro Abs 3.7  1.7 - 7.7 K/uL   Lymphocytes Relative 36  12 - 46 %   Lymphs Abs 2.5  0.7 - 4.0 K/uL   Monocytes  Relative 5  3 - 12 %   Monocytes Absolute 0.4  0.1 - 1.0 K/uL   Eosinophils Relative 5  0 - 5 %   Eosinophils Absolute 0.3  0.0 - 0.7 K/uL   Basophils Relative 0  0 - 1 %   Basophils Absolute 0.0  0.0 - 0.1 K/uL  TROPONIN I      Component Value Range   Troponin I <0.30  <0.30 ng/mL       MDM  Nursing notes including past medical history and social history reviewed and considered in documentation Labs/vital reviewed and considered      Date: 07/30/2012 0900am  Rate: 152  Rhythm: supraventricular tachycardia (SVT)  QRS Axis: normal  Intervals: normal  ST/T Wave abnormalities: nonspecific ST changes  Conduction Disutrbances:none  Narrative Interpretation:   Old EKG Reviewed: changes noted and SVT is new    Date: 07/30/2012 0919  Rate: 123  Rhythm: atrial fibrillation  QRS Axis: normal  Intervals: normal  ST/T Wave abnormalities: nonspecific ST changes  Conduction Disutrbances:none  Narrative Interpretation:   Old EKG Reviewed: changes noted and afib now noted on repeat EKG   Date: 07/30/2012 1239pm  Rate: 79  Rhythm: normal sinus rhythm  QRS Axis: normal  Intervals: normal  ST/T Wave abnormalities: normal  Conduction Disutrbances:none  Narrative Interpretation:   Old EKG Reviewed: changes noted and afib resolved       I personally performed the services described in this documentation, which was scribed in my presence. The recorded information has been reviewed and is accurate.      Toni Gaskins, MD 07/30/12 1352

## 2012-07-30 NOTE — Progress Notes (Signed)
Called by Dr. Bebe Shaggy about patient with new onset a-fib with RVR. Short duration with spontaneous conversion to sinus. She does not have a cardiologist. He asked about anticoagulation recommendations.  My best advice (without seeing her or examining her) based on her history and risk factors is to start daily aspirin and low dose Toprol XL 12.5 mg for rate control if she has recurrent a-fib. He feels she is safe for discharge home. Will need to fully evaluate in the office and examine risk factors before additional anticoagulation recommendations can be made.  The office will contact her Monday to schedule an appointment with me in [place], which should occur in the next 1-2 weeks.  Thanks for the consult.  Chrystie Nose, MD, [facility] Attending Cardiologist [facility]

## 2012-07-30 NOTE — ED Notes (Signed)
Tachycardia that started approx 2 hrs pta.  Pt states she woke up with this.  Denies cp.  C/o sob and dizziness.  States has had this in the past but has never been treated.

## 2013-02-01 ENCOUNTER — Ambulatory Visit (INDEPENDENT_AMBULATORY_CARE_PROVIDER_SITE_OTHER): Payer: BC Managed Care – PPO | Admitting: Internal Medicine

## 2013-02-01 ENCOUNTER — Encounter: Payer: Self-pay | Admitting: Internal Medicine

## 2013-02-01 VITALS — BP 146/90 | HR 79 | Ht 64.0 in | Wt 273.7 lb

## 2013-02-01 DIAGNOSIS — E669 Obesity, unspecified: Secondary | ICD-10-CM

## 2013-02-01 DIAGNOSIS — R002 Palpitations: Secondary | ICD-10-CM

## 2013-02-01 DIAGNOSIS — R5381 Other malaise: Secondary | ICD-10-CM

## 2013-02-01 DIAGNOSIS — E119 Type 2 diabetes mellitus without complications: Secondary | ICD-10-CM

## 2013-02-01 DIAGNOSIS — E785 Hyperlipidemia, unspecified: Secondary | ICD-10-CM

## 2013-02-01 DIAGNOSIS — I4891 Unspecified atrial fibrillation: Secondary | ICD-10-CM

## 2013-02-01 DIAGNOSIS — R5383 Other fatigue: Secondary | ICD-10-CM

## 2013-02-01 MED ORDER — METOPROLOL SUCCINATE ER 25 MG PO TB24: 25.0000 mg | ORAL_TABLET | Freq: Every day | ORAL | Status: DC

## 2013-02-01 NOTE — Patient Instructions (Addendum)
Your physician wants you to follow-up in: 6 months.    You will receive a reminder letter in the mail two months in advance. If you don't receive a letter, please call our office to schedule the follow-up appointment.  Your physician has recommended you make the following change in your medication: Start taking 25mg  Metoprolol (1 tab) daily.

## 2013-02-01 NOTE — Progress Notes (Signed)
OFFICE NOTE  Chief Complaint:  Routine office visit  Primary Care Physician: No primary provider on file.  HPI:  Toni Olsen  is a 47 year old female who was recently in the emergency room at [facility] for palpitations. She reported that she had had a chest cold and head cold for a week prior to this episode. She had been taking Sudafed, Mucinex D, as well as an Alka-Seltzer Cold Plus medication for this quite frequently. She then presented with palpitations which were acute onset. In the emergency room, she was found to be in a narrow complex tachyarrhythmia which was fairly regular, thought to be SVT. She received adenosine x2 doses with no improvement in her heart rate. She was then started on Cardizem which lowered her heart rate and then demonstrated atrial fibrillation with rapid ventricular response. This persisted for about 2 hours and then her rhythm eventually broke into a sinus rhythm. I was contacted as she has no prior cardiologist. I recommended starting her on aspirin and a beta blocker and we saw her back in the office today. Since that time, she has had no further recurrence of these episodes and has noted increased stress. She is not a smoker but does use a small amount of caffeine. Occasionally she gets palpitations but they're short lived and seemed to be related to stress. She's decreased her exercise recently and has not been able to lose weight. Her blood pressure actually is a little higher today than it is typically. She reports that she never underwent a sleep study as we had requested due to problems with her insurance.  PMHx:  Past Medical History  Diagnosis Date  . Diabetes mellitus without complication   . Hypertension     Past Surgical History  Procedure Laterality Date  . Abdominal hysterectomy    . Cesarean section    . Cholecystectomy      FAMHx:  History reviewed. No pertinent family history.  SOCHx:   reports that she has never smoked. She has never  used smokeless tobacco. She reports that  drinks alcohol. She reports that she does not use illicit drugs.  ALLERGIES:  Allergies  Allergen Reactions  . Penicillins Rash  . Sulfa Antibiotics Rash    ROS: A comprehensive review of systems was negative except for: Cardiovascular: positive for palpitations  HOME MEDS: Current Outpatient Prescriptions  Medication Sig Dispense Refill  . aspirin EC 325 MG tablet Take 1 tablet (325 mg total) by mouth daily.  30 tablet  0  . fish oil-omega-3 fatty acids 1000 MG capsule Take by mouth daily.      . hydrochlorothiazide (MICROZIDE) 12.5 MG capsule Take 12.5 mg by mouth daily.      . metFORMIN (GLUCOPHAGE) 500 MG tablet Take 500 mg by mouth 2 (two) times daily with a meal.      . Multiple Vitamin (MULTIVITAMIN WITH MINERALS) TABS Take 1 tablet by mouth daily.      . metoprolol succinate (TOPROL XL) 25 MG 24 hr tablet Take 1 tablet (25 mg total) by mouth daily.  30 tablet  11   No current facility-administered medications for this visit.    LABS/IMAGING: No results found for this or any previous visit (from the past 48 hour(s)). No results found.  VITALS: BP 146/90  Pulse 79  Ht 5\' 4"  (1.626 m)  Wt 273 lb 11.2 oz (124.15 kg)  BMI 46.96 kg/m2  EXAM: General appearance: alert and no distress Neck: no adenopathy, no carotid bruit, no  JVD, supple, symmetrical, trachea midline and thyroid not enlarged, symmetric, no tenderness/mass/nodules Lungs: clear to auscultation bilaterally Heart: regular rate and rhythm, S1, S2 normal, no murmur, click, rub or gallop Abdomen: soft, non-tender; bowel sounds normal; no masses,  no organomegaly Extremities: extremities normal, atraumatic, no cyanosis or edema Pulses: 2+ and symmetric Skin: Skin color, texture, turgor normal. No rashes or lesions Neurologic: Grossly normal  EKG: Normal sinus rhythm at 79  ASSESSMENT: 1. Low major fibrillation with intermittent  palpitations 2. Hypertension 3. Morbid obesity 4. Diabetes type 2  PLAN: 1.   Mrs. Kantor continues to have very infrequent palpitations. This is most likely lone atrial fibrillation. Her blood pressure is not at goal I would recommend increasing her metoprolol XL to one full tablet (25 mg) daily. She is on daily aspirin for stroke prevention and her chads 2 score is low. I strongly encourage continued work on diet and exercise and offered a nutrition consult if she's interested. She also could contact the weight management Center at [facility] long to be evaluated for comprehensive weight management and/or bariatric surgery.  Chrystie Nose, MD, [facility] Attending Cardiologist [facility]  Philomena Buttermore C 02/01/2013, 10:40 AM

## 2013-03-02 ENCOUNTER — Encounter: Payer: Self-pay | Admitting: Family Medicine

## 2013-03-02 ENCOUNTER — Ambulatory Visit (INDEPENDENT_AMBULATORY_CARE_PROVIDER_SITE_OTHER): Payer: BC Managed Care – PPO | Admitting: Family Medicine

## 2013-03-02 VITALS — BP 130/82 | HR 74 | Temp 97.7°F | Resp 18 | Ht 62.0 in | Wt 275.0 lb

## 2013-03-02 DIAGNOSIS — E119 Type 2 diabetes mellitus without complications: Secondary | ICD-10-CM

## 2013-03-02 DIAGNOSIS — Z1231 Encounter for screening mammogram for malignant neoplasm of breast: Secondary | ICD-10-CM

## 2013-03-02 DIAGNOSIS — Z01419 Encounter for gynecological examination (general) (routine) without abnormal findings: Secondary | ICD-10-CM

## 2013-03-02 DIAGNOSIS — I4891 Unspecified atrial fibrillation: Secondary | ICD-10-CM

## 2013-03-02 DIAGNOSIS — Z130 Encounter for screening for diseases of the blood and blood-forming organs and certain disorders involving the immune mechanism: Secondary | ICD-10-CM

## 2013-03-02 DIAGNOSIS — E785 Hyperlipidemia, unspecified: Secondary | ICD-10-CM

## 2013-03-02 DIAGNOSIS — E669 Obesity, unspecified: Secondary | ICD-10-CM

## 2013-03-02 DIAGNOSIS — Z1321 Encounter for screening for nutritional disorder: Secondary | ICD-10-CM

## 2013-03-02 DIAGNOSIS — I10 Essential (primary) hypertension: Secondary | ICD-10-CM

## 2013-03-02 LAB — CBC WITH DIFFERENTIAL/PLATELET
Basophils Absolute: 0 10*3/uL (ref 0.0–0.1)
Basophils Relative: 0 % (ref 0–1)
HCT: 38.6 % (ref 36.0–46.0)
Lymphocytes Relative: 23 % (ref 12–46)
MCHC: 33.9 g/dL (ref 30.0–36.0)
Monocytes Absolute: 0.5 10*3/uL (ref 0.1–1.0)
Neutro Abs: 4.5 10*3/uL (ref 1.7–7.7)
Neutrophils Relative %: 65 % (ref 43–77)
Platelets: 346 10*3/uL (ref 150–400)
RDW: 14.4 % (ref 11.5–15.5)
WBC: 6.9 10*3/uL (ref 4.0–10.5)

## 2013-03-02 LAB — COMPREHENSIVE METABOLIC PANEL
ALT: 18 U/L (ref 0–35)
AST: 14 U/L (ref 0–37)
Albumin: 3.9 g/dL (ref 3.5–5.2)
CO2: 26 mEq/L (ref 19–32)
Calcium: 10.4 mg/dL (ref 8.4–10.5)
Chloride: 104 mEq/L (ref 96–112)
Creat: 0.77 mg/dL (ref 0.50–1.10)
Potassium: 4.9 mEq/L (ref 3.5–5.3)
Total Protein: 7.4 g/dL (ref 6.0–8.3)

## 2013-03-02 LAB — LIPID PANEL
HDL: 44 mg/dL (ref >39)
LDL Cholesterol: 115 mg/dL — ABNORMAL HIGH (ref 0–99)
Triglycerides: 156 mg/dL — ABNORMAL HIGH (ref <150)

## 2013-03-02 LAB — HEMOGLOBIN A1C
Hgb A1c MFr Bld: 6.9 % — ABNORMAL HIGH (ref <5.7)
Mean Plasma Glucose: 151 mg/dL — ABNORMAL HIGH (ref <117)

## 2013-03-02 LAB — MICROALBUMIN / CREATININE URINE RATIO: Microalb Creat Ratio: 8.5 mg/g (ref 0.0–30.0)

## 2013-03-02 MED ORDER — METFORMIN HCL 500 MG PO TABS: 500.0000 mg | ORAL_TABLET | Freq: Two times a day (BID) | ORAL | Status: DC

## 2013-03-02 MED ORDER — HYDROCHLOROTHIAZIDE 12.5 MG PO CAPS: 12.5000 mg | ORAL_CAPSULE | Freq: Every day | ORAL | Status: DC

## 2013-03-02 MED ORDER — METFORMIN HCL ER (MOD) 1000 MG PO TB24: 1000.0000 mg | ORAL_TABLET | Freq: Every day | ORAL | Status: DC

## 2013-03-02 NOTE — Progress Notes (Signed)
  Subjective:    Patient ID: Toni Olsen, female    DOB: 02-Dec-1965, 47 y.o.   MRN: [id number]  HPI  Pt here new patient CPE. No specific concerns. Last PCP [facility] internal medicine Cardiology Dr. Rennis Golden Medications and history reviewed Patient has a history of diabetes mellitus greater than 5 years. She is on metformin it does give her some loose stools. She often will take both tablets at the same time even though this is not extended release. She has a meter at home but does not check her blood sugar on a regular basis.  A-Fib- she's had an episode of atrial fibrillation she is followed by cardiology she was recently in the emergency room with palpitations concerning for her SVT she was given adenosine she has seen cardiology since then and her beta blocker was adjusted.  Obesity- she has long-standing history of adult obesity. She previously was working out at [employer] however has stopped this secondary to her job. 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  She has had hysterectomy secondary to fibroid tumors however she's been having Pap smears every 2 years  She does have a family history of breast cancer in her mother who had at age 23 she has been getting her mammograms on a regular basis states that she had one sometime last year at the breast Center in [facility] Review of Systems   GEN- denies fatigue, fever, weight loss,weakness, recent illness HEENT- denies eye drainage, change in vision, nasal discharge, CVS- denies chest pain, palpitations RESP- denies SOB, cough, wheeze ABD- denies N/V, change in stools, abd pain GU- denies dysuria, hematuria, dribbling, incontinence MSK- denies joint pain, muscle aches, injury Neuro- denies headache, dizziness, syncope, seizure activity      Objective:   Physical Exam  GEN- NAD, alert and oriented x3, obese HEENT- PERRL, EOMI, non injected sclera, pink conjunctiva, MMM, oropharynx clear Neck- Supple, no  thryomegaly CVS- RRR, no murmur RESP-CTAB Breast- normal symmetry, no nipple inversion,no nipple drainage, no nodules or lumps felt Nodes- no axillary nodes GU- normal external genitalia, vaginal mucosa pink and moist, no cervix, no uterus present, urethra normal position, ovaries not palpable Rectum- normal extenral appearance, normal tone, FOBT- neg EXT- No edema Pulses- Radial, DP- 2+         Assessment & Plan:    GYN exam- Pap smear done on vaginal cuff. If this is negative I do not think that she needs anything further. Mammogram will be set up. She will have fasting labs including those for her comorbidities.  Indications are up-to-date.

## 2013-03-02 NOTE — Assessment & Plan Note (Signed)
We discussed her obesity as well as her comorbidities. She's aware that she needs to lose weight and change her diet and is committed to doing so. She is also planning to restart at the [facility]

## 2013-03-02 NOTE — Patient Instructions (Addendum)
I recommend eye visit once a year I recommend dental visit every 6 months Goal is to  Exercise 30 minutes 5 days a week We will call with lab results Medications refilled Records release- [facility] Mammogram to be scheduled F/U 3 months

## 2013-03-02 NOTE — Assessment & Plan Note (Signed)
Will obtain urine Miro, A1C, check renal function Unknown why not on ACEI Obtain records first She does have some loose stools we will see how she does on the ER

## 2013-03-02 NOTE — Assessment & Plan Note (Signed)
For now all restart hydrochlorothiazide will check her renal function as well as a urine might crazy she can tolerate an ACE inhibitor  Will continue her Toprol given by her cardiologist

## 2013-03-02 NOTE — Assessment & Plan Note (Signed)
Check fasting lipid panel she's not on statin therapy

## 2013-03-02 NOTE — Assessment & Plan Note (Signed)
Reviewed cardiology note. She is on beta blocker

## 2013-03-05 ENCOUNTER — Telehealth: Payer: Self-pay | Admitting: Family Medicine

## 2013-03-05 MED ORDER — METFORMIN HCL ER 500 MG PO TB24: 1000.0000 mg | ORAL_TABLET | Freq: Every day | ORAL | Status: DC

## 2013-03-05 MED ORDER — LISINOPRIL-HYDROCHLOROTHIAZIDE 10-12.5 MG PO TABS: 1.0000 | ORAL_TABLET | Freq: Every day | ORAL | Status: DC

## 2013-03-05 MED ORDER — SIMVASTATIN 10 MG PO TABS: 10.0000 mg | ORAL_TABLET | Freq: Every day | ORAL | Status: DC

## 2013-03-05 MED ORDER — NYSTATIN 100000 UNIT/GM EX POWD: CUTANEOUS | Status: DC

## 2013-03-05 NOTE — Progress Notes (Signed)
Subjective:     Patient ID: Toni Olsen, female   DOB: March 16, 1966, 47 y.o.   MRN: [id number]  HPI   Review of Systems     Objective:   Physical Exam     Assessment:       Skin- erythema beneath bilaterla breast, few cracks in skin    Plan:      Intertrigo beneath breast due to large breast and moisture trapping, Nystatin powder

## 2013-03-05 NOTE — Telephone Encounter (Signed)
New Metformin dose sent 500mg  ER- 2 tablets daily

## 2013-03-05 NOTE — Telephone Encounter (Signed)
Metformin ER 1000 mg is going to cost the patient $178.52. Pharmacy wants to know if you can change to Metformin ER 500 2 QD #60. It would only be $4.00. Please send in new Rx to pharmacy.

## 2013-03-05 NOTE — Addendum Note (Signed)
Addended by: Milinda Antis F on: 03/05/2013 08:47 AM   Modules accepted: Orders, Medications

## 2013-03-06 LAB — PAP IG (IMAGE GUIDED)

## 2013-03-07 LAB — VITAMIN D 1,25 DIHYDROXY
Vitamin D 1, 25 (OH)2 Total: 100 pg/mL — ABNORMAL HIGH (ref 18–72)
Vitamin D3 1, 25 (OH)2: 100 pg/mL

## 2013-03-08 ENCOUNTER — Encounter: Payer: Self-pay | Admitting: *Deleted

## 2013-05-28 ENCOUNTER — Other Ambulatory Visit: Payer: Self-pay | Admitting: Family Medicine

## 2013-05-28 DIAGNOSIS — Z139 Encounter for screening, unspecified: Secondary | ICD-10-CM

## 2013-05-31 ENCOUNTER — Ambulatory Visit (HOSPITAL_COMMUNITY)
Admission: RE | Admit: 2013-05-31 | Discharge: 2013-05-31 | Disposition: A | Discharge status: Home / Self Care | Payer: BC Managed Care – PPO | Source: Ambulatory Visit | Attending: Family Medicine | Admitting: Family Medicine

## 2013-05-31 DIAGNOSIS — Z1231 Encounter for screening mammogram for malignant neoplasm of breast: Secondary | ICD-10-CM | POA: Insufficient documentation

## 2013-05-31 DIAGNOSIS — Z139 Encounter for screening, unspecified: Secondary | ICD-10-CM

## 2013-06-01 ENCOUNTER — Ambulatory Visit: Payer: BC Managed Care – PPO | Admitting: Family Medicine

## 2013-06-04 ENCOUNTER — Other Ambulatory Visit: Payer: Self-pay | Admitting: Family Medicine

## 2013-06-04 ENCOUNTER — Encounter: Payer: Self-pay | Admitting: Family Medicine

## 2013-06-04 ENCOUNTER — Ambulatory Visit (INDEPENDENT_AMBULATORY_CARE_PROVIDER_SITE_OTHER): Payer: BC Managed Care – PPO | Admitting: Family Medicine

## 2013-06-04 ENCOUNTER — Telehealth: Payer: Self-pay | Admitting: Family Medicine

## 2013-06-04 VITALS — BP 120/72 | HR 82 | Temp 98.7°F | Resp 20 | Ht 63.0 in | Wt 270.0 lb

## 2013-06-04 DIAGNOSIS — E669 Obesity, unspecified: Secondary | ICD-10-CM

## 2013-06-04 DIAGNOSIS — Z23 Encounter for immunization: Secondary | ICD-10-CM

## 2013-06-04 DIAGNOSIS — E119 Type 2 diabetes mellitus without complications: Secondary | ICD-10-CM

## 2013-06-04 DIAGNOSIS — I10 Essential (primary) hypertension: Secondary | ICD-10-CM

## 2013-06-04 DIAGNOSIS — R209 Unspecified disturbances of skin sensation: Secondary | ICD-10-CM

## 2013-06-04 DIAGNOSIS — R202 Paresthesia of skin: Secondary | ICD-10-CM

## 2013-06-04 DIAGNOSIS — E785 Hyperlipidemia, unspecified: Secondary | ICD-10-CM

## 2013-06-04 LAB — CBC WITH DIFFERENTIAL/PLATELET
Basophils Absolute: 0 10*3/uL (ref 0.0–0.1)
Basophils Relative: 1 % (ref 0–1)
Eosinophils Relative: 4 % (ref 0–5)
HCT: 40.6 % (ref 36.0–46.0)
Lymphocytes Relative: 29 % (ref 12–46)
MCH: 26.4 pg (ref 26.0–34.0)
MCHC: 32.8 g/dL (ref 30.0–36.0)
MCV: 80.7 fL (ref 78.0–100.0)
Monocytes Absolute: 0.5 10*3/uL (ref 0.1–1.0)
Monocytes Relative: 8 % (ref 3–12)
Platelets: 377 10*3/uL (ref 150–400)
RDW: 14.4 % (ref 11.5–15.5)
WBC: 6.5 10*3/uL (ref 4.0–10.5)

## 2013-06-04 LAB — COMPREHENSIVE METABOLIC PANEL
Albumin: 4.1 g/dL (ref 3.5–5.2)
BUN: 10 mg/dL (ref 6–23)
CO2: 27 mEq/L (ref 19–32)
Calcium: 11.1 mg/dL — ABNORMAL HIGH (ref 8.4–10.5)
Chloride: 97 mEq/L (ref 96–112)
Glucose, Bld: 143 mg/dL — ABNORMAL HIGH (ref 70–99)
Potassium: 4.2 mEq/L (ref 3.5–5.3)

## 2013-06-04 LAB — LIPID PANEL
Cholesterol: 204 mg/dL — ABNORMAL HIGH (ref 0–200)
HDL: 46 mg/dL (ref >39)
Total CHOL/HDL Ratio: 4.4 Ratio
Triglycerides: 192 mg/dL — ABNORMAL HIGH (ref <150)

## 2013-06-04 MED ORDER — LISINOPRIL-HYDROCHLOROTHIAZIDE 10-12.5 MG PO TABS: 1.0000 | ORAL_TABLET | Freq: Every day | ORAL | Status: DC

## 2013-06-04 MED ORDER — SIMVASTATIN 20 MG PO TABS: 20.0000 mg | ORAL_TABLET | Freq: Every day | ORAL | Status: DC

## 2013-06-04 NOTE — Assessment & Plan Note (Addendum)
Congratulated on weight loss. She will continue with her current routine, return goal set Add multivitamin

## 2013-06-04 NOTE — Assessment & Plan Note (Signed)
Well controlled 

## 2013-06-04 NOTE — Assessment & Plan Note (Signed)
Zocor will be increased to 20 mg

## 2013-06-04 NOTE — Assessment & Plan Note (Signed)
Last A1c 6.9%. Recheck done today we'll titrate medications as needed. She's on an ACE inhibitor as well as aspirin and a statin drug

## 2013-06-04 NOTE — Progress Notes (Signed)
  Subjective:    Patient ID: Toni Olsen, female    DOB: 1966-04-15, 47 y.o.   MRN: [id number]  HPI  Pt here to f/u chronic medical problems. DM- taking metformin 1gram daily, has not checked CBG recently.no polyuria, polydipsia SHe has been working out on regular basis for hte past month and has made changes to diet, grilled food, more water, cut out snacks junk food, she has lost 5lbs in 4 weeks She does complain of a tingling on both thighs , worse with sitting, occasionally has a night feels she has to kick her leg around to make it stops, but denies restless legs, leg spasm, back pain or heaviness in legs. No recent injury     Review of Systems - per above   GEN- denies fatigue, fever, weight loss,weakness, recent illness HEENT- denies eye drainage, change in vision, nasal discharge, CVS- denies chest pain, palpitations RESP- denies SOB, cough, wheeze ABD- denies N/V, change in stools, abd pain GU- denies dysuria, hematuria, dribbling, incontinence MSK- denies joint pain, muscle aches, injury Neuro- denies headache, dizziness, syncope, seizure activity      Objective:   Physical Exam GEN- NAD, alert and oriented x3 HEENT- PERRL, EOMI, non injected sclera, pink conjunctiva, MMM, oropharynx clear Neck- Supple,  CVS- RRR, no murmur RESP-CTAB MSK- Spine NT, neg SLR, normal ROM Back Neuro- CNII-XII grossly in tact , DTR symmetric, normal tone, motor equal bilat EXT- No edema Pulses- Radial, DP- 2+        Assessment & Plan:

## 2013-06-04 NOTE — Assessment & Plan Note (Signed)
She has some neuropathic symptoms in her thighs. This could be coming from her back secondary to location. I highly doubt restless leg syndrome at this time. Other possibilities is increased use of her muscles with her change in activity. At this time she does want to monitor her symptoms as they are not causing any significant discomfort

## 2013-06-04 NOTE — Telephone Encounter (Signed)
Toni Olsen was just seen in the office and she was wanting to know if Medical Records have been faxed or received [phone]

## 2013-06-04 NOTE — Patient Instructions (Signed)
Work on eye doctor visit Continue current medications Add multivitamin once a day  Congratulations on weight loss! F/U 3 months

## 2013-06-05 LAB — HEMOGLOBIN A1C: Mean Plasma Glucose: 180 mg/dL — ABNORMAL HIGH (ref <117)

## 2013-06-05 MED ORDER — METFORMIN HCL 1000 MG PO TABS: 1000.0000 mg | ORAL_TABLET | Freq: Two times a day (BID) | ORAL | Status: DC

## 2013-06-05 MED ORDER — SIMVASTATIN 40 MG PO TABS: 40.0000 mg | ORAL_TABLET | Freq: Every day | ORAL | Status: DC

## 2013-06-05 NOTE — Addendum Note (Signed)
Addended by: Milinda Antis F on: 06/05/2013 09:44 PM   Modules accepted: Orders

## 2013-06-13 ENCOUNTER — Ambulatory Visit (INDEPENDENT_AMBULATORY_CARE_PROVIDER_SITE_OTHER): Payer: BC Managed Care – PPO | Admitting: Family Medicine

## 2013-06-13 VITALS — BP 110/70 | HR 82 | Temp 98.2°F | Resp 18 | Ht 63.0 in | Wt 273.0 lb

## 2013-06-13 DIAGNOSIS — J019 Acute sinusitis, unspecified: Secondary | ICD-10-CM

## 2013-06-13 DIAGNOSIS — J069 Acute upper respiratory infection, unspecified: Secondary | ICD-10-CM

## 2013-06-13 MED ORDER — AZITHROMYCIN 250 MG PO TABS: ORAL_TABLET | ORAL | Status: DC

## 2013-06-13 NOTE — Patient Instructions (Signed)
Take the antibiotics  Robitussin - plain  F/U as previous

## 2013-06-14 ENCOUNTER — Encounter: Payer: Self-pay | Admitting: Family Medicine

## 2013-06-14 NOTE — Progress Notes (Signed)
   Subjective:    Patient ID: Toni Olsen, female    DOB: 09/17/1965, 47 y.o.   MRN: [id number]  HPI  Patient presents with  Worsening sinus congestion congestion for the past week. Out of her chest and she now has a cough with some production. This started 2 days after she received her flu shot. She does admit to mild low-grade fever last week. She denies any shortness of breath or chest pain. She has been using over-the-counter Alka-Seltzer which helps.   Review of Systems   GEN- denies fatigue,+ fever, weight loss,weakness, recent illness HEENT- denies eye drainage, change in vision, +nasal discharge, CVS- denies chest pain, palpitations RESP- denies SOB, +cough, wheeze ABD- denies N/V, change in stools, abd pain Neuro- denies headache, dizziness, syncope, seizure activity      Objective:   Physical Exam GEN- NAD, alert and oriented x3 HEENT- PERRL, EOMI, non injected sclera, pink conjunctiva, MMM, oropharynx mild injection, TM clear bilat no effusion,  + maxillary sinus tenderness, inflammed turbinates,  Nasal drainage  Neck- Supple, no LAD CVS- RRR, no murmur RESP-CTAB EXT- No edema Pulses- Radial 2+         Assessment & Plan:

## 2013-06-14 NOTE — Assessment & Plan Note (Signed)
I do not think that this is related to the flu shot. She's been prescribed azithromycin secondary to allergy to penicillin. She can also take Robitussin plain Mucinex

## 2013-06-14 NOTE — Assessment & Plan Note (Signed)
Per above 

## 2013-08-02 ENCOUNTER — Encounter: Payer: Self-pay | Admitting: Internal Medicine

## 2013-08-02 ENCOUNTER — Encounter: Payer: Self-pay | Admitting: *Deleted

## 2013-08-03 ENCOUNTER — Ambulatory Visit: Payer: BC Managed Care – PPO | Admitting: Internal Medicine

## 2013-08-20 ENCOUNTER — Ambulatory Visit (INDEPENDENT_AMBULATORY_CARE_PROVIDER_SITE_OTHER): Payer: BC Managed Care – PPO | Admitting: Internal Medicine

## 2013-08-20 ENCOUNTER — Encounter: Payer: Self-pay | Admitting: Internal Medicine

## 2013-08-20 VITALS — BP 128/96 | HR 78 | Ht 64.0 in | Wt 272.6 lb

## 2013-08-20 DIAGNOSIS — R5383 Other fatigue: Secondary | ICD-10-CM

## 2013-08-20 DIAGNOSIS — R5381 Other malaise: Secondary | ICD-10-CM

## 2013-08-20 DIAGNOSIS — E119 Type 2 diabetes mellitus without complications: Secondary | ICD-10-CM

## 2013-08-20 DIAGNOSIS — I10 Essential (primary) hypertension: Secondary | ICD-10-CM

## 2013-08-20 DIAGNOSIS — E785 Hyperlipidemia, unspecified: Secondary | ICD-10-CM

## 2013-08-20 DIAGNOSIS — I4891 Unspecified atrial fibrillation: Secondary | ICD-10-CM

## 2013-08-20 NOTE — Progress Notes (Signed)
OFFICE NOTE  Chief Complaint:  Routine office visit  Primary Care Physician: Milinda AntisURHAM, KAWANTA, MD  HPI:  Toni Olsen  is a 48 year old female who was recently in the emergency room at [facility] for palpitations. She reported that she had had a chest cold and head cold for a week prior to this episode. She had been taking Sudafed, Mucinex D, as well as an Alka-Seltzer Cold Plus medication for this quite frequently. She then presented with palpitations which were acute onset. In the emergency room, she was found to be in a narrow complex tachyarrhythmia which was fairly regular, thought to be SVT. She received adenosine x2 doses with no improvement in her heart rate. She was then started on Cardizem which lowered her heart rate and then demonstrated atrial fibrillation with rapid ventricular response. This persisted for about 2 hours and then her rhythm eventually broke into a sinus rhythm. I was contacted as she has no prior cardiologist. I recommended starting her on aspirin and a beta blocker and we saw her back in the office today. Since that time, she has had no further recurrence of these episodes and has noted increased stress. She is not a smoker but does use a small amount of caffeine. Occasionally she gets palpitations but they're short lived and seemed to be related to stress. She's decreased her exercise recently and has not been able to lose weight.   Toni Olsen returns today for followup. Over the holidays she had increased her weight even more and this was associated with an increase in her hemoglobin A1c to 7.9. She had laboratory work in November which demonstrated total cholesterol 204, triglycerides 192, HDL 46 and LDL 120.  Her primary care provider increased her simvastatin 40 mg.  She also had an increase in her metformin to 1000 mg twice daily.  Since then Toni Olsen has been working on major dietary changes, exercise, and is walking now 2 miles per day. She has had some weight loss  and feels much better.  She's also cut sodas out of her diet.  PMHx:  Past Medical History  Diagnosis Date  . Diabetes mellitus without complication     type 2  . Hypertension   . Atrial fibrillation     isolated - setting of decongestant & stimulant use  . Obesity     Past Surgical History  Procedure Laterality Date  . Abdominal hysterectomy  1995  . Cesarean section  1990  . Cholecystectomy  2002  . Sleep study  06/2004    mild OSA/hypopnea syndrome  . Transthoracic echocardiogram  04/2007    EF=>55%, mild asymmetric LVH; trace MR/TR  . Nm myocar perf wall motion  04/2007    bruce myoview; normal pattern of perfusion to all regions; post-stress EF 70%; normal, low risk scan     FAMHx:  Family History  Problem Relation Age of Onset  . Breast cancer Mother 2953  . Kidney disease Mother     kidney abscess  . Diabetes Father     prostate cancer  . Renal cancer Sister   . Sickle cell trait Sister   . Hypertension Brother   . Healthy Son     SOCHx:   reports that she has never smoked. She has never used smokeless tobacco. She reports that she drinks alcohol. She reports that she does not use illicit drugs.  ALLERGIES:  Allergies  Allergen Reactions  . Penicillins Rash  . Sulfa Antibiotics Rash    ROS:  A comprehensive review of systems was negative.  HOME MEDS: Current Outpatient Prescriptions  Medication Sig Dispense Refill  . aspirin EC 325 MG tablet Take 1 tablet (325 mg total) by mouth daily.  30 tablet  0  . lisinopril-hydrochlorothiazide (ZESTORETIC) 10-12.5 MG per tablet Take 1 tablet by mouth daily.  30 tablet  3  . metFORMIN (GLUCOPHAGE) 1000 MG tablet Take 1 tablet (1,000 mg total) by mouth 2 (two) times daily with a meal.  60 tablet  3  . metoprolol succinate (TOPROL XL) 25 MG 24 hr tablet Take 1 tablet (25 mg total) by mouth daily.  30 tablet  11  . Multiple Vitamin (MULTIVITAMIN WITH MINERALS) TABS Take 1 tablet by mouth daily.      . simvastatin  (ZOCOR) 40 MG tablet Take 1 tablet (40 mg total) by mouth at bedtime.  30 tablet  3   No current facility-administered medications for this visit.    LABS/IMAGING: No results found for this or any previous visit (from the past 48 hour(s)). No results found.  VITALS: BP 128/96  Pulse 78  Ht 5\' 4"  (1.626 m)  Wt 272 lb 9.6 oz (123.651 kg)  BMI 46.77 kg/m2  EXAM: General appearance: alert and no distress Neck: no adenopathy, no carotid bruit, no JVD, supple, symmetrical, trachea midline and thyroid not enlarged, symmetric, no tenderness/mass/nodules Lungs: clear to auscultation bilaterally Heart: regular rate and rhythm, S1, S2 normal, no murmur, click, rub or gallop Abdomen: soft, non-tender; bowel sounds normal; no masses,  no organomegaly Extremities: extremities normal, atraumatic, no cyanosis or edema Pulses: 2+ and symmetric Skin: Skin color, texture, turgor normal. No rashes or lesions Neurologic: Grossly normal  EKG: Normal sinus rhythm at 78  ASSESSMENT: 1. Lone atrial fibrillation with intermittent palpitations - improved on B-blocker 2. Hypertension 3. Morbid obesity 4. Diabetes type 2 5. Dyslipidemia  PLAN: 1.   Toni Olsen continues to struggle with weight and the consequences of this. Her diabetes had worsened requiring more metformin. She also had a recent increase in her simvastatin 40 mg daily.  His TCU to see if this provided her some benefit, however her goal LDL is less than 70 and she may not reach that on the increased dose of simvastatin. Unfortunately she cannot increase simvastatin to 80 mg secondary to the FDA recommendations.  If she requires further reduction in cholesterol to reach her goal, we could consider adding Zetia or changing her to Vytorin 40/10 mg daily. I continue to encourage her to work on diet and exercise and weight loss and we'll see her back annually or sooner as necessary.  Chrystie Nose, MD, [facility] Attending Cardiologist The  [facility]  Marli Diego C 08/20/2013, 10:43 AM

## 2013-08-20 NOTE — Patient Instructions (Signed)
Your physician wants you to follow-up in: 1 year. You will receive a reminder letter in the mail two months in advance. If you don't receive a letter, please call our office to schedule the follow-up appointment.  

## 2013-09-04 ENCOUNTER — Ambulatory Visit: Payer: BC Managed Care – PPO | Admitting: Family Medicine

## 2013-12-26 ENCOUNTER — Encounter: Payer: Self-pay | Admitting: Family Medicine

## 2013-12-26 ENCOUNTER — Telehealth: Payer: Self-pay | Admitting: *Deleted

## 2013-12-26 ENCOUNTER — Ambulatory Visit (INDEPENDENT_AMBULATORY_CARE_PROVIDER_SITE_OTHER): Payer: BC Managed Care – PPO | Admitting: Family Medicine

## 2013-12-26 VITALS — BP 138/86 | HR 78 | Temp 98.0°F | Resp 16 | Ht 63.0 in | Wt 263.0 lb

## 2013-12-26 DIAGNOSIS — E669 Obesity, unspecified: Secondary | ICD-10-CM

## 2013-12-26 DIAGNOSIS — E785 Hyperlipidemia, unspecified: Secondary | ICD-10-CM

## 2013-12-26 DIAGNOSIS — I10 Essential (primary) hypertension: Secondary | ICD-10-CM

## 2013-12-26 DIAGNOSIS — E119 Type 2 diabetes mellitus without complications: Secondary | ICD-10-CM

## 2013-12-26 DIAGNOSIS — N390 Urinary tract infection, site not specified: Secondary | ICD-10-CM

## 2013-12-26 DIAGNOSIS — R109 Unspecified abdominal pain: Secondary | ICD-10-CM

## 2013-12-26 LAB — CBC WITH DIFFERENTIAL/PLATELET
Basophils Absolute: 0 10*3/uL (ref 0.0–0.1)
Basophils Relative: 0 % (ref 0–1)
Eosinophils Absolute: 0.2 10*3/uL (ref 0.0–0.7)
Eosinophils Relative: 3 % (ref 0–5)
HCT: 41.3 % (ref 36.0–46.0)
Hemoglobin: 13.8 g/dL (ref 12.0–15.0)
LYMPHS ABS: 2.1 10*3/uL (ref 0.7–4.0)
Lymphocytes Relative: 27 % (ref 12–46)
MCH: 27 pg (ref 26.0–34.0)
MCHC: 33.4 g/dL (ref 30.0–36.0)
MCV: 80.7 fL (ref 78.0–100.0)
Monocytes Absolute: 0.6 10*3/uL (ref 0.1–1.0)
Monocytes Relative: 8 % (ref 3–12)
NEUTROS ABS: 4.7 10*3/uL (ref 1.7–7.7)
NEUTROS PCT: 62 % (ref 43–77)
PLATELETS: 363 10*3/uL (ref 150–400)
RBC: 5.12 MIL/uL — ABNORMAL HIGH (ref 3.87–5.11)
RDW: 13.8 % (ref 11.5–15.5)
WBC: 7.6 10*3/uL (ref 4.0–10.5)

## 2013-12-26 LAB — HEMOGLOBIN A1C
Hgb A1c MFr Bld: 7.3 % — ABNORMAL HIGH (ref <5.7)
MEAN PLASMA GLUCOSE: 163 mg/dL — AB (ref <117)

## 2013-12-26 LAB — URINALYSIS, MICROSCOPIC ONLY
Casts: NONE SEEN
Crystals: NONE SEEN

## 2013-12-26 LAB — COMPREHENSIVE METABOLIC PANEL
ALT: 15 U/L (ref 0–35)
AST: 13 U/L (ref 0–37)
Albumin: 4.1 g/dL (ref 3.5–5.2)
Alkaline Phosphatase: 82 U/L (ref 39–117)
BUN: 13 mg/dL (ref 6–23)
CHLORIDE: 101 meq/L (ref 96–112)
CO2: 28 meq/L (ref 19–32)
Calcium: 10.7 mg/dL — ABNORMAL HIGH (ref 8.4–10.5)
Creat: 0.78 mg/dL (ref 0.50–1.10)
Glucose, Bld: 142 mg/dL — ABNORMAL HIGH (ref 70–99)
POTASSIUM: 4.8 meq/L (ref 3.5–5.3)
SODIUM: 136 meq/L (ref 135–145)
TOTAL PROTEIN: 8 g/dL (ref 6.0–8.3)
Total Bilirubin: 0.4 mg/dL (ref 0.2–1.2)

## 2013-12-26 LAB — URINALYSIS, ROUTINE W REFLEX MICROSCOPIC
BILIRUBIN URINE: NEGATIVE
Glucose, UA: NEGATIVE mg/dL
Hgb urine dipstick: NEGATIVE
Ketones, ur: NEGATIVE mg/dL
NITRITE: NEGATIVE
PROTEIN: NEGATIVE mg/dL
Specific Gravity, Urine: 1.015 (ref 1.005–1.030)
UROBILINOGEN UA: 0.2 mg/dL (ref 0.0–1.0)
pH: 6.5 (ref 5.0–8.0)

## 2013-12-26 LAB — LIPID PANEL
CHOLESTEROL: 196 mg/dL (ref 0–200)
HDL: 50 mg/dL (ref >39)
LDL Cholesterol: 112 mg/dL — ABNORMAL HIGH (ref 0–99)
Total CHOL/HDL Ratio: 3.9 Ratio
Triglycerides: 171 mg/dL — ABNORMAL HIGH (ref <150)
VLDL: 34 mg/dL (ref 0–40)

## 2013-12-26 MED ORDER — CEPHALEXIN 500 MG PO CAPS: 500.0000 mg | ORAL_CAPSULE | Freq: Two times a day (BID) | ORAL | Status: DC

## 2013-12-26 MED ORDER — FLUCONAZOLE 150 MG PO TABS: 150.0000 mg | ORAL_TABLET | Freq: Once | ORAL | Status: DC

## 2013-12-26 MED ORDER — LISINOPRIL 10 MG PO TABS: 10.0000 mg | ORAL_TABLET | Freq: Every day | ORAL | Status: DC

## 2013-12-26 NOTE — Telephone Encounter (Signed)
Patient seen in office and ABTx initiated for UTI.   Patient requested order for Diflucan after ABTx completed.   Ok to order?

## 2013-12-26 NOTE — Telephone Encounter (Signed)
Okay to send 

## 2013-12-26 NOTE — Patient Instructions (Signed)
Continue to work on weight loss New dose of blood pressure pill We will call with labs Antibiotic for urine infection  F/U 3 months

## 2013-12-26 NOTE — Telephone Encounter (Signed)
Prescription sent to pharmacy.

## 2013-12-27 LAB — MICROALBUMIN / CREATININE URINE RATIO
Creatinine, Urine: 125.2 mg/dL
MICROALB UR: 0.53 mg/dL (ref 0.00–1.89)
Microalb Creat Ratio: 4.2 mg/g (ref 0.0–30.0)

## 2013-12-28 ENCOUNTER — Other Ambulatory Visit: Payer: Self-pay | Admitting: *Deleted

## 2013-12-28 DIAGNOSIS — E119 Type 2 diabetes mellitus without complications: Secondary | ICD-10-CM

## 2013-12-28 MED ORDER — SIMVASTATIN 40 MG PO TABS: 40.0000 mg | ORAL_TABLET | Freq: Every day | ORAL | Status: DC

## 2013-12-28 MED ORDER — SITAGLIPTIN PHOSPHATE 100 MG PO TABS: 100.0000 mg | ORAL_TABLET | Freq: Every day | ORAL | Status: DC

## 2013-12-29 NOTE — Assessment & Plan Note (Signed)
A1c checked along with other fasting labs. She'll be started on Januvia she does not tolerate the metformin her goal is an A1c less than 7%

## 2013-12-29 NOTE — Assessment & Plan Note (Signed)
Continue statin drug. LDL goal less than 100

## 2013-12-29 NOTE — Assessment & Plan Note (Signed)
Her blood pressure looks okay despite not having all of her medications. We will continue metoprolol 25 mg because of her history of arrhythmia. I will change her other meds to lisinopril 10 mg once a day

## 2013-12-29 NOTE — Progress Notes (Signed)
Patient ID: Toni Richardsudrey D Kosloski, female   DOB: 08-Dec-1965, 48 y.o.   MRN: [id number]   Subjective:    Patient ID: Toni RichardsAudrey D Moyano, female    DOB: 08-Dec-1965, 48 y.o.   MRN: [id number]  Patient presents for Back pain  Patient here to follow chronic medical problems. She does complain of some dysuria and flank pain for the past couple days. She's not had any blood in the urine. No fever.  Diabetes mellitus she's been followed for diabetes mellitus A1c was 7.9% which was greater than 6 months ago. She's not checked her blood sugar regular basis. She's not taking the metformin states that it caused significant diarrhea every time she ate.  Hypertension she's history of hypertension as well as history of arrhythmia. She hasn't taken her atenolol but has not been taking lisinopril HCTZ. She's also not been taking her statin drug her regular basis she is willing to predicament to her health today   Review Of Systems:  GEN- denies fatigue, fever, weight loss,weakness, recent illness HEENT- denies eye drainage, change in vision, nasal discharge, CVS- denies chest pain, palpitations RESP- denies SOB, cough, wheeze ABD- denies N/V, change in stools, abd pain GU- + dysuria, denies hematuria, dribbling, incontinence MSK- denies joint pain, muscle aches, injury Neuro- denies headache, dizziness, syncope, seizure activity       Objective:    BP 138/86  Pulse 78  Temp(Src) 98 F (36.7 C) (Oral)  Resp 16  Ht 5\' 3"  (1.6 m)  Wt 263 lb (119.296 kg)  BMI 46.60 kg/m2 GEN- NAD, alert and oriented x3 HEENT- PERRL, EOMI, non injected sclera, pink conjunctiva, MMM, oropharynx clear Neck- Supple, no thyromegaly CVS- RRR, no murmur RESP-CTAB ABD-NABS,soft,NT,ND, no CVA tenderness EXT- No edema Pulses- Radial, DP- 2+        Assessment & Plan:      Problem List Items Addressed This Visit   UTI (urinary tract infection)     Treat with antibiotics uncomplicated UTI    Relevant Medications   cephALEXin (KEFLEX) capsule   HYPERTENSION     Her blood pressure looks okay despite not having all of her medications. We will continue metoprolol 25 mg because of her history of arrhythmia. I will change her other meds to lisinopril 10 mg once a day    Relevant Medications      lisinopril (PRINIVIL,ZESTRIL) tablet   HYPERLIPIDEMIA     Continue statin drug. LDL goal less than 100    Relevant Medications      lisinopril (PRINIVIL,ZESTRIL) tablet   Other Relevant Orders      Lipid panel (Completed)   DIABETES MELLITUS     A1c checked along with other fasting labs. She'll be started on Januvia she does not tolerate the metformin her goal is an A1c less than 7%    Relevant Medications      lisinopril (PRINIVIL,ZESTRIL) tablet   Other Relevant Orders      CBC with Differential (Completed)      Comprehensive metabolic panel (Completed)      Hemoglobin A1c (Completed)      Microalbumin / creatinine urine ratio (Completed)    Other Visit Diagnoses   Flank pain    -  Primary    Relevant Orders       Urinalysis, Routine w reflex microscopic (Completed)       Note: This dictation was prepared with Dragon dictation along with smaller phrase technology. Any transcriptional errors that result from this process are unintentional.

## 2013-12-29 NOTE — Assessment & Plan Note (Signed)
Treat with antibiotics uncomplicated UTI

## 2014-01-01 ENCOUNTER — Telehealth: Payer: Self-pay | Admitting: *Deleted

## 2014-01-01 MED ORDER — GLIPIZIDE 5 MG PO TABS: 5.0000 mg | ORAL_TABLET | Freq: Every day | ORAL | Status: DC

## 2014-01-01 NOTE — Telephone Encounter (Signed)
Message copied by Phillips OdorSIX, CHRISTINA H on Tue Jan 01, 2014 12:21 PM ------      Message from: Malvin JohnsBULLINS, SUSAN S      Created: Tue Jan 01, 2014 12:16 PM       Patient calling to let you know that her diabetes medication that we prescribed is too expensive and she cant afford it please call her back at [phone] ------

## 2014-01-01 NOTE — Telephone Encounter (Signed)
Call placed to patient and patient made aware.   Prescription sent to pharmacy.  

## 2014-01-01 NOTE — Telephone Encounter (Signed)
Call placed to patient.   States that Januvia is $385.  Reports that she cannot afford medication.   States that she resumed Metformin at night so that side effect of diarrhea is not as detrimental.   MD please advise.

## 2014-01-01 NOTE — Telephone Encounter (Signed)
Send glipizide 5mg  once a day , d/c [place]

## 2014-02-26 ENCOUNTER — Other Ambulatory Visit: Payer: Self-pay | Admitting: Internal Medicine

## 2014-02-27 NOTE — Telephone Encounter (Signed)
Rx was sent to pharmacy electronically. 

## 2014-03-29 ENCOUNTER — Ambulatory Visit: Payer: BC Managed Care – PPO | Admitting: Family Medicine

## 2014-09-03 ENCOUNTER — Encounter: Payer: Self-pay | Admitting: Family Medicine

## 2014-10-01 ENCOUNTER — Encounter: Payer: Self-pay | Admitting: Family Medicine

## 2015-05-05 ENCOUNTER — Other Ambulatory Visit: Payer: Self-pay | Admitting: Family Medicine

## 2015-05-05 DIAGNOSIS — Z1231 Encounter for screening mammogram for malignant neoplasm of breast: Secondary | ICD-10-CM

## 2015-05-12 ENCOUNTER — Ambulatory Visit (INDEPENDENT_AMBULATORY_CARE_PROVIDER_SITE_OTHER): Payer: PRIVATE HEALTH INSURANCE | Admitting: Family Medicine

## 2015-05-12 ENCOUNTER — Encounter: Payer: Self-pay | Admitting: Family Medicine

## 2015-05-12 VITALS — BP 140/82 | HR 68 | Temp 98.5°F | Resp 16 | Ht 63.0 in | Wt 273.0 lb

## 2015-05-12 DIAGNOSIS — E119 Type 2 diabetes mellitus without complications: Secondary | ICD-10-CM | POA: Diagnosis not present

## 2015-05-12 DIAGNOSIS — I10 Essential (primary) hypertension: Secondary | ICD-10-CM

## 2015-05-12 DIAGNOSIS — Z113 Encounter for screening for infections with a predominantly sexual mode of transmission: Secondary | ICD-10-CM | POA: Diagnosis not present

## 2015-05-12 DIAGNOSIS — E785 Hyperlipidemia, unspecified: Secondary | ICD-10-CM | POA: Diagnosis not present

## 2015-05-12 DIAGNOSIS — E669 Obesity, unspecified: Secondary | ICD-10-CM | POA: Diagnosis not present

## 2015-05-12 DIAGNOSIS — Z23 Encounter for immunization: Secondary | ICD-10-CM

## 2015-05-12 LAB — COMPREHENSIVE METABOLIC PANEL
ALBUMIN: 4 g/dL (ref 3.6–5.1)
ALT: 20 U/L (ref 6–29)
AST: 17 U/L (ref 10–35)
Alkaline Phosphatase: 84 U/L (ref 33–130)
BUN: 10 mg/dL (ref 7–25)
CALCIUM: 10.5 mg/dL — AB (ref 8.6–10.4)
CHLORIDE: 102 mmol/L (ref 98–110)
CO2: 26 mmol/L (ref 20–31)
Creat: 0.81 mg/dL (ref 0.50–1.05)
Glucose, Bld: 155 mg/dL — ABNORMAL HIGH (ref 70–99)
Potassium: 4.6 mmol/L (ref 3.5–5.3)
SODIUM: 138 mmol/L (ref 135–146)
TOTAL PROTEIN: 7.5 g/dL (ref 6.1–8.1)
Total Bilirubin: 0.4 mg/dL (ref 0.2–1.2)

## 2015-05-12 LAB — CBC WITH DIFFERENTIAL/PLATELET
BASOS PCT: 0 % (ref 0–1)
Basophils Absolute: 0 10*3/uL (ref 0.0–0.1)
Eosinophils Absolute: 0.2 10*3/uL (ref 0.0–0.7)
Eosinophils Relative: 3 % (ref 0–5)
HEMATOCRIT: 40.8 % (ref 36.0–46.0)
HEMOGLOBIN: 13.5 g/dL (ref 12.0–15.0)
LYMPHS ABS: 1.9 10*3/uL (ref 0.7–4.0)
LYMPHS PCT: 26 % (ref 12–46)
MCH: 27.2 pg (ref 26.0–34.0)
MCHC: 33.1 g/dL (ref 30.0–36.0)
MCV: 82.3 fL (ref 78.0–100.0)
MONO ABS: 0.5 10*3/uL (ref 0.1–1.0)
MPV: 9.6 fL (ref 8.6–12.4)
Monocytes Relative: 7 % (ref 3–12)
NEUTROS PCT: 64 % (ref 43–77)
Neutro Abs: 4.7 10*3/uL (ref 1.7–7.7)
Platelets: 339 10*3/uL (ref 150–400)
RBC: 4.96 MIL/uL (ref 3.87–5.11)
RDW: 13.9 % (ref 11.5–15.5)
WBC: 7.3 10*3/uL (ref 4.0–10.5)

## 2015-05-12 LAB — LIPID PANEL
CHOLESTEROL: 204 mg/dL — AB (ref 125–200)
HDL: 46 mg/dL (ref >=46)
LDL Cholesterol: 118 mg/dL (ref <130)
Total CHOL/HDL Ratio: 4.4 Ratio (ref <=5.0)
Triglycerides: 202 mg/dL — ABNORMAL HIGH (ref <150)
VLDL: 40 mg/dL — ABNORMAL HIGH (ref <30)

## 2015-05-12 LAB — RPR

## 2015-05-12 LAB — HIV ANTIBODY (ROUTINE TESTING W REFLEX): HIV 1&2 Ab, 4th Generation: NONREACTIVE

## 2015-05-12 LAB — HEMOGLOBIN A1C
Hgb A1c MFr Bld: 8 % — ABNORMAL HIGH (ref <5.7)
MEAN PLASMA GLUCOSE: 183 mg/dL — AB (ref <117)

## 2015-05-12 LAB — TSH: TSH: 2.867 u[IU]/mL (ref 0.350–4.500)

## 2015-05-12 NOTE — Assessment & Plan Note (Signed)
She has started working out and working on diet, will encourage her to stick with her current routine, she lost 50lbs in the past doing this. Her obesity contributes to joint aches and pains, discussed she can try glucosamine but weight loss is the only factor to help this

## 2015-05-12 NOTE — Assessment & Plan Note (Signed)
BP high normal, she does not want to start any meds unless necessary she wants to try things naturally, she has appt with heart doctor, wants to avoid going back on BB if possible., as she has been assymptomatic will not start anything today. Note at end of visit asked about diet pills, I decline this 1- because of previous cardiac arrhythmia and unknown lab work, if cardiology clears her I will consider

## 2015-05-12 NOTE — Patient Instructions (Addendum)
Glucosamine Chondroitin for joints and inflammation (Osteo Bi flex)  We will call with labs  Continue vitamins Try anti-histamine - Claritin  Robitussin  Flu shot given F/U 3 MONTHS

## 2015-05-12 NOTE — Assessment & Plan Note (Signed)
She is making strides with activity, reiterated diet, she is on protein shakes as well and vitamins Check A1C and decide on medications, did not tolerate MTF, previously on glipizide

## 2015-05-12 NOTE — Progress Notes (Signed)
Patient ID: Toni Richardsudrey D Olsen, female   DOB: 1966-06-30, 49 y.o.   MRN: [id number]   Subjective:    Patient ID: Toni RichardsAudrey D Olsen, female    DOB: 1966-06-30, 49 y.o.   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 She is now getting things back on track and is coming in to have her labs done seizure needs to be put back on medication. She states that she has positive people around her now she is back to working out she is doing crossfit she is doing well at her job. She is taking a multivitamin most days. History of DM, HTN, Hyperlipidemia, A fib. She has been off of her beta blocker denies any atrial fibrillation symptoms denies any palpitations or chest pain. She does get stiffness in her back and knees, but is still able to work out, asked about a supplement for her joints  Has has mild cough a couple of days, nonproductive, no fever, does not feel sick, no meds taken   Review Of Systems:  GEN- denies fatigue, fever, weight loss,weakness, recent illness HEENT- denies eye drainage, change in vision, nasal discharge, CVS- denies chest pain, palpitations RESP- denies SOB, cough, wheeze ABD- denies N/V, change in stools, abd pain GU- denies dysuria, hematuria, dribbling, incontinence MSK- + joint pain, muscle aches, injury Neuro- denies headache, dizziness, syncope, seizure activity       Objective:    BP 140/82 mmHg  Pulse 68  Temp(Src) 98.5 F (36.9 C) (Oral)  Resp 16  Ht 5\' 3"  (1.6 m)  Wt 273 lb (123.832 kg)  BMI 48.37 kg/m2 GEN- NAD, alert and oriented x3,obese  10lbs heavier than 1 year ago  HEENT- PERRL, EOMI, non injected sclera, pink conjunctiva, MMM, oropharynx clear, nares clear  Neck- Supple, no thyromegaly CVS- RRR, no  murmur RESP-CTAB ABD-NABS,soft,NT,ND EXT- No edema Pulses- Radial, DP- 2+        Assessment & Plan:      Problem List Items Addressed This Visit    OBESITY   Hyperlipidemia   Relevant Orders   Lipid panel   Essential hypertension   Relevant Orders   CBC with Differential/Platelet   Comprehensive metabolic panel   TSH   Diabetes mellitus, type II (HCC) - Primary   Relevant Orders   Hemoglobin A1c   Microalbumin / creatinine urine ratio    Other Visit Diagnoses    Screen for STD (sexually transmitted disease)        Relevant Orders    HIV antibody    RPR    Need for prophylactic vaccination and inoculation against influenza        Relevant Orders    Flu Vaccine QUAD 36+ mos IM (Fluarix & Fluzone Quad PF (Completed)       Note: This dictation was prepared with Dragon dictation along with smaller Lobbyistphrase technology. Any transcriptional errors that result from this process are unintentional.

## 2015-05-13 LAB — MICROALBUMIN / CREATININE URINE RATIO
CREATININE, URINE: 156 mg/dL (ref 20–320)
Microalb Creat Ratio: 8 mcg/mg creat (ref <30)
Microalb, Ur: 1.3 mg/dL

## 2015-05-16 ENCOUNTER — Other Ambulatory Visit: Payer: Self-pay | Admitting: *Deleted

## 2015-05-16 ENCOUNTER — Other Ambulatory Visit: Payer: Self-pay | Admitting: Family Medicine

## 2015-05-16 ENCOUNTER — Ambulatory Visit (HOSPITAL_COMMUNITY)
Admission: RE | Admit: 2015-05-16 | Discharge: 2015-05-16 | Disposition: A | Discharge status: Home / Self Care | Payer: PRIVATE HEALTH INSURANCE | Source: Ambulatory Visit | Attending: Family Medicine | Admitting: Family Medicine

## 2015-05-16 DIAGNOSIS — Z1231 Encounter for screening mammogram for malignant neoplasm of breast: Secondary | ICD-10-CM

## 2015-05-16 MED ORDER — SITAGLIPTIN PHOSPHATE 100 MG PO TABS: 100.0000 mg | ORAL_TABLET | Freq: Every day | ORAL | Status: DC

## 2015-08-15 ENCOUNTER — Ambulatory Visit: Payer: PRIVATE HEALTH INSURANCE | Admitting: Family Medicine

## 2016-02-27 ENCOUNTER — Encounter: Payer: Self-pay | Admitting: Family Medicine

## 2016-02-27 ENCOUNTER — Ambulatory Visit (INDEPENDENT_AMBULATORY_CARE_PROVIDER_SITE_OTHER): Payer: PRIVATE HEALTH INSURANCE | Admitting: Family Medicine

## 2016-02-27 VITALS — BP 134/74 | HR 74 | Temp 98.5°F | Resp 14 | Ht 63.0 in | Wt 266.0 lb

## 2016-02-27 DIAGNOSIS — G4700 Insomnia, unspecified: Secondary | ICD-10-CM

## 2016-02-27 DIAGNOSIS — E1143 Type 2 diabetes mellitus with diabetic autonomic (poly)neuropathy: Secondary | ICD-10-CM | POA: Diagnosis not present

## 2016-02-27 DIAGNOSIS — I480 Paroxysmal atrial fibrillation: Secondary | ICD-10-CM

## 2016-02-27 DIAGNOSIS — E669 Obesity, unspecified: Secondary | ICD-10-CM

## 2016-02-27 DIAGNOSIS — I10 Essential (primary) hypertension: Secondary | ICD-10-CM

## 2016-02-27 DIAGNOSIS — E1140 Type 2 diabetes mellitus with diabetic neuropathy, unspecified: Secondary | ICD-10-CM | POA: Insufficient documentation

## 2016-02-27 LAB — GLUCOSE, FINGERSTICK (STAT): Glucose, fingerstick: 217 mg/dL — ABNORMAL HIGH (ref 70–99)

## 2016-02-27 MED ORDER — EMPAGLIFLOZIN 10 MG PO TABS: 10.0000 mg | ORAL_TABLET | Freq: Every day | ORAL | 0 refills | Status: DC

## 2016-02-27 MED ORDER — METOPROLOL SUCCINATE ER 25 MG PO TB24: 25.0000 mg | ORAL_TABLET | Freq: Every day | ORAL | 3 refills | Status: DC

## 2016-02-27 MED ORDER — BLOOD GLUCOSE SYSTEM PAK KIT: PACK | 1 refills | Status: DC

## 2016-02-27 MED ORDER — BLOOD GLUCOSE TEST VI STRP: ORAL_STRIP | 1 refills | Status: DC

## 2016-02-27 MED ORDER — LANCET DEVICES MISC: 1 refills | Status: DC

## 2016-02-27 MED ORDER — LANCETS MISC: 0 refills | Status: DC

## 2016-02-27 NOTE — Progress Notes (Signed)
Subjective:    Patient ID: Toni Richardsudrey D Storie, female    DOB: 12-Dec-1965, 50 y.o.   MRN: [id number]  Patient presents for Follow-up (DM- states that she wants to discuss tx) and Peripheral Neuropathy (pain in BLE from hips down- tingling and burning)  Patient here for follow-up. She states that she now except that she has diabetes mellitus and is ready to get her health back on track. She has not been taking any medication. She is quite adamant that she cannot tolerate metformin due to the GI side effects nausea vomiting diarrhea. She states she's been checking her blood sugar it has been 200s to 300s randomly. She's been trying to watch her diet has been trying to exercise readily. She has noted increased tingling and numbness in her legs and her feet at nighttime she'll to get a burning sensation near her hip area. She's also had an episode this week of what sounds like A. fib. She is history of paroxysmal A fib was on metoprolol in the past but she just discontinued it not wanting to have any medical problems. No current chest pain. She would like to reestablish with her cardiologist  DM- last A1C 8%, last seen in Oct 2016 ,last took Januvia samples for 1 week    Review Of Systems:  GEN- denies fatigue, fever, weight loss,weakness, recent illness HEENT- denies eye drainage, change in vision, nasal discharge, CVS- denies chest pain, palpitations RESP- denies SOB, cough, wheeze ABD- denies N/V, change in stools, abd pain GU- denies dysuria, hematuria, dribbling, incontinence MSK- denies joint pain, muscle aches, injury Neuro- denies headache, dizziness, syncope, seizure activity       Objective:    BP 134/74 (BP Location: Left Arm, Patient Position: Sitting, Cuff Size: Large)   Pulse 74   Temp 98.5 F (36.9 C) (Oral)   Resp 14   Ht 5\' 3"  (1.6 m)   Wt 266 lb (120.7 kg)   BMI 47.12 kg/m  GEN- NAD, alert and oriented x3 HEENT- PERRL, EOMI, non injected sclera, pink conjunctiva, MMM,  oropharynx clear Neck- Supple, no thyromegaly CVS- RRR, no murmur RESP-CTAB ABD-NABS,soft,NT,ND EXT- No edema Pulses- Radial, DP- 2+  EKG-NSR, poor r wave progression  gLUCOSE - 217      Assessment & Plan:      Problem List Items Addressed This Visit    PAF (paroxysmal atrial fibrillation) (HCC)    She had episode this week, restart metoprolol, refer to cardiology I will check her labs today including TSH      Relevant Medications   metoprolol succinate (TOPROL-XL) 25 MG 24 hr tablet   OBESITY   Relevant Medications   empagliflozin (JARDIANCE) 10 MG TABS tablet   Insomnia    He also limits the difficulty with sleep recommend for now she just try melatonin over-the-counter until we get her other metabolic disturbances in  check      Essential hypertension - Primary    BP looks okay, toprol per above       Relevant Medications   metoprolol succinate (TOPROL-XL) 25 MG 24 hr tablet   Other Relevant Orders   CBC with Differential/Platelet (Completed)   Comprehensive metabolic panel (Completed)   Lipid panel (Completed)   TSH (Completed)   EKG 12-Lead (Completed)   Diabetic neuropathy (HCC)   Relevant Medications   empagliflozin (JARDIANCE) 10 MG TABS tablet   Diabetes mellitus, type II (HCC)    Uncontrolled, labs done Glucose in office [address] Start [place]ard[place], given script for  just 10mg  As I dont have A1C or renal function She declines MTF due to GI symptoms Discussed diet and weight components Will need statin drug in future  Concern for diabetic neuropathy as well       Relevant Medications   empagliflozin (JARDIANCE) 10 MG TABS tablet   Other Relevant Orders   Hemoglobin A1c (Completed)   Lipid panel (Completed)   Glucose, fingerstick (stat) (Completed)   Microalbumin / creatinine urine ratio (Completed)   Urinalysis, Routine w reflex microscopic (not at [facility]) (Completed)    Other Visit Diagnoses   None.     Note: This dictation was prepared with Dragon  dictation along with smaller phrase technology. Any transcriptional errors that result from this process are unintentional.

## 2016-02-27 NOTE — Patient Instructions (Addendum)
Referral cardiology  Start the metoprolol once a day  Start jardiance once a day  Check blood sugar every morning  Melatonin for sleep  F/U Sept 8th

## 2016-02-27 NOTE — Assessment & Plan Note (Signed)
BP looks okay, toprol per above

## 2016-02-27 NOTE — Assessment & Plan Note (Signed)
She had episode this week, restart metoprolol, refer to cardiology I will check her labs today including TSH

## 2016-02-27 NOTE — Assessment & Plan Note (Signed)
He also limits the difficulty with sleep recommend for now she just try melatonin over-the-counter until we get her other metabolic disturbances in  check

## 2016-02-27 NOTE — Assessment & Plan Note (Addendum)
Uncontrolled, labs done Glucose in office [address] [place]ard[place], given script for just 10mg  As I dont have A1C or renal function She declines MTF due to GI symptoms Discussed diet and weight components Will need statin drug in future  Concern for diabetic neuropathy as well

## 2016-02-28 LAB — URINALYSIS, MICROSCOPIC ONLY
Casts: NONE SEEN [LPF]
Crystals: NONE SEEN [HPF]
Squamous Epithelial / LPF: NONE SEEN [HPF] (ref <=5)
YEAST: NONE SEEN [HPF]

## 2016-02-28 LAB — LIPID PANEL
CHOL/HDL RATIO: 4 ratio (ref <=5.0)
Cholesterol: 229 mg/dL — ABNORMAL HIGH (ref 125–200)
HDL: 57 mg/dL (ref >=46)
LDL CALC: 130 mg/dL — AB (ref <130)
Triglycerides: 212 mg/dL — ABNORMAL HIGH (ref <150)
VLDL: 42 mg/dL — ABNORMAL HIGH (ref <30)

## 2016-02-28 LAB — URINALYSIS, ROUTINE W REFLEX MICROSCOPIC
BILIRUBIN URINE: NEGATIVE
KETONES UR: NEGATIVE
Nitrite: POSITIVE — AB
Protein, ur: NEGATIVE
SPECIFIC GRAVITY, URINE: 1.018 (ref 1.001–1.035)
pH: 5.5 (ref 5.0–8.0)

## 2016-02-28 LAB — CBC WITH DIFFERENTIAL/PLATELET
BASOS PCT: 0 %
Basophils Absolute: 0 cells/uL (ref 0–200)
Eosinophils Absolute: 186 cells/uL (ref 15–500)
Eosinophils Relative: 3 %
HCT: 43.3 % (ref 35.0–45.0)
HEMOGLOBIN: 14 g/dL (ref 12.0–15.0)
LYMPHS PCT: 36 %
Lymphs Abs: 2232 cells/uL (ref 850–3900)
MCH: 26.6 pg — ABNORMAL LOW (ref 27.0–33.0)
MCHC: 32.3 g/dL (ref 32.0–36.0)
MCV: 82.3 fL (ref 80.0–100.0)
MONO ABS: 496 {cells}/uL (ref 200–950)
MONOS PCT: 8 %
MPV: 9.5 fL (ref 7.5–12.5)
NEUTROS ABS: 3286 {cells}/uL (ref 1500–7800)
Neutrophils Relative %: 53 %
PLATELETS: 316 10*3/uL (ref 140–400)
RBC: 5.26 MIL/uL — AB (ref 3.80–5.10)
RDW: 14.3 % (ref 11.0–15.0)
WBC: 6.2 10*3/uL (ref 3.8–10.8)

## 2016-02-28 LAB — COMPREHENSIVE METABOLIC PANEL
ALBUMIN: 4.1 g/dL (ref 3.6–5.1)
ALT: 24 U/L (ref 6–29)
AST: 18 U/L (ref 10–35)
Alkaline Phosphatase: 95 U/L (ref 33–130)
BILIRUBIN TOTAL: 0.3 mg/dL (ref 0.2–1.2)
BUN: 13 mg/dL (ref 7–25)
CO2: 24 mmol/L (ref 20–31)
CREATININE: 0.84 mg/dL (ref 0.50–1.05)
Calcium: 10.4 mg/dL (ref 8.6–10.4)
Chloride: 100 mmol/L (ref 98–110)
Glucose, Bld: 220 mg/dL — ABNORMAL HIGH (ref 70–99)
Potassium: 4.3 mmol/L (ref 3.5–5.3)
SODIUM: 138 mmol/L (ref 135–146)
TOTAL PROTEIN: 7.6 g/dL (ref 6.1–8.1)

## 2016-02-28 LAB — HEMOGLOBIN A1C
HEMOGLOBIN A1C: 9.8 % — AB (ref <5.7)
MEAN PLASMA GLUCOSE: 235 mg/dL

## 2016-02-28 LAB — TSH: TSH: 3.18 m[IU]/L

## 2016-02-28 LAB — MICROALBUMIN / CREATININE URINE RATIO
CREATININE, URINE: 150 mg/dL (ref 20–320)
MICROALB/CREAT RATIO: 13 ug/mg{creat} (ref <30)
Microalb, Ur: 1.9 mg/dL

## 2016-02-29 ENCOUNTER — Encounter: Payer: Self-pay | Admitting: Family Medicine

## 2016-03-02 ENCOUNTER — Other Ambulatory Visit: Payer: Self-pay | Admitting: *Deleted

## 2016-03-02 MED ORDER — CEPHALEXIN 500 MG PO CAPS: 500.0000 mg | ORAL_CAPSULE | Freq: Two times a day (BID) | ORAL | 0 refills | Status: DC

## 2016-03-03 ENCOUNTER — Other Ambulatory Visit: Payer: Self-pay | Admitting: *Deleted

## 2016-03-03 MED ORDER — FLUCONAZOLE 150 MG PO TABS: 150.0000 mg | ORAL_TABLET | Freq: Once | ORAL | 0 refills | Status: AC

## 2016-03-10 ENCOUNTER — Telehealth: Payer: Self-pay | Admitting: Family Medicine

## 2016-03-10 MED ORDER — EMPAGLIFLOZIN 10 MG PO TABS: 10.0000 mg | ORAL_TABLET | Freq: Every day | ORAL | 3 refills | Status: DC

## 2016-03-10 NOTE — Telephone Encounter (Signed)
Dr Jeanice Limdurham gave patient samples of jardiance, she said her numbers are coming down, she would like to know if she should still take this, if so she would need prescription called in to [facility] [place]  [phone]

## 2016-03-10 NOTE — Telephone Encounter (Signed)
Prescription sent to pharmacy.   Call placed to patient and patient made aware per VM. 

## 2016-03-19 ENCOUNTER — Encounter: Payer: Self-pay | Admitting: Family Medicine

## 2016-03-19 ENCOUNTER — Ambulatory Visit (INDEPENDENT_AMBULATORY_CARE_PROVIDER_SITE_OTHER): Payer: PRIVATE HEALTH INSURANCE | Admitting: Family Medicine

## 2016-03-19 VITALS — BP 136/78 | HR 70 | Temp 98.6°F | Resp 14 | Ht 63.0 in | Wt 261.0 lb

## 2016-03-19 DIAGNOSIS — E1143 Type 2 diabetes mellitus with diabetic autonomic (poly)neuropathy: Secondary | ICD-10-CM

## 2016-03-19 DIAGNOSIS — M25561 Pain in right knee: Secondary | ICD-10-CM

## 2016-03-19 DIAGNOSIS — E785 Hyperlipidemia, unspecified: Secondary | ICD-10-CM

## 2016-03-19 DIAGNOSIS — M25562 Pain in left knee: Secondary | ICD-10-CM | POA: Diagnosis not present

## 2016-03-19 DIAGNOSIS — I10 Essential (primary) hypertension: Secondary | ICD-10-CM | POA: Diagnosis not present

## 2016-03-19 NOTE — Patient Instructions (Addendum)
Referral to orthopedics Continue the Jardiance as prescribed for now Call me in 2 weeks to update the on prescription coverage F/U 3 months

## 2016-03-19 NOTE — Progress Notes (Signed)
   Subjective:    Patient ID: Toni Olsen, female    DOB: July 21, 1965, 50 y.o.   MRN: [id number]  Patient presents for Follow-up (2 weeks- is not fasting)  Pt here for intermin f/u on DM- A1C was 9.8%, now on jardiance but not sure if she has prescription drug plan, is checking on this, needs samples today  CBG since jardiance have come down to 164-181 fasting depending on diet, previous above 250 BP 138-146/ 70-80's. Shooting pain and tingling in feet has improved  Started back on Toprol due to PAF history, her palpiations have improved has cardiology appt coming up   Today wants to discuss her bilat knee pain, has pain and swelling, difficult walking. WOuld like to see ortho. Takes tylenol if severe. Tries to workout gets pain       Review Of Systems:  GEN- denies fatigue, fever, weight loss,weakness, recent illness HEENT- denies eye drainage, change in vision, nasal discharge, CVS- denies chest pain, palpitations RESP- denies SOB, cough, wheeze ABD- denies N/V, change in stools, abd pain GU- denies dysuria, hematuria, dribbling, incontinence MSK- +joint pain, muscle aches, injury Neuro- denies headache, dizziness, syncope, seizure activity       Objective:    BP 136/78 (BP Location: Left Arm, Patient Position: Sitting, Cuff Size: Large)   Pulse 70   Temp 98.6 F (37 C) (Oral)   Resp 14   Ht 5\' 3"  (1.6 m)   Wt 261 lb (118.4 kg)   BMI 46.23 kg/m  GEN- NAD, alert and oriented x3 CVS- RRR, no murmur RESP-CTAB MSK- Fair ROM, no effusion, +crepitus, ligaments in tact  EXT- No edema Pulses- Radial, DP- 2+        Assessment & Plan:      Problem List Items Addressed This Visit    None    Visit Diagnoses   None.     Note: This dictation was prepared with Dragon dictation along with smaller phrase technology. Any transcriptional errors that result from this process are unintentional.

## 2016-03-20 NOTE — Assessment & Plan Note (Signed)
Reviewed labs with pt LDL and TG above goal, will treat dm for 3 months and recheck and then decide on treatment. She is trying to avoid multiple medications So will approach slowly with her

## 2016-03-20 NOTE — Assessment & Plan Note (Signed)
BP looks okay today, would benefit from ACE as well Will see cardiology and review there recommendations first

## 2016-03-20 NOTE — Assessment & Plan Note (Signed)
Continue Jardiance she will f/u with insurance next week, given samples from office If she has no coverage will need to try sulfonurea which I prefer not to due to her obesity Can not tolerate MTF Depending on cardiology consider ACTOS

## 2016-03-20 NOTE — Assessment & Plan Note (Signed)
Weight management will be most beneficial. She will be referred for evaluation

## 2016-04-09 ENCOUNTER — Encounter: Payer: Self-pay | Admitting: Cardiovascular Disease

## 2016-04-09 ENCOUNTER — Ambulatory Visit (INDEPENDENT_AMBULATORY_CARE_PROVIDER_SITE_OTHER): Payer: PRIVATE HEALTH INSURANCE | Admitting: Cardiovascular Disease

## 2016-04-09 VITALS — BP 126/80 | HR 90 | Ht 67.0 in | Wt 261.0 lb

## 2016-04-09 DIAGNOSIS — Z7189 Other specified counseling: Secondary | ICD-10-CM | POA: Diagnosis not present

## 2016-04-09 DIAGNOSIS — I480 Paroxysmal atrial fibrillation: Secondary | ICD-10-CM | POA: Diagnosis not present

## 2016-04-09 DIAGNOSIS — E785 Hyperlipidemia, unspecified: Secondary | ICD-10-CM

## 2016-04-09 DIAGNOSIS — I10 Essential (primary) hypertension: Secondary | ICD-10-CM

## 2016-04-09 NOTE — Patient Instructions (Signed)
Your physician wants you to follow-up in: 6 months You will receive a reminder letter in the mail two months in advance. If you don't receive a letter, please call our office to schedule the follow-up appointment.     Your physician recommends that you continue on your current medications as directed. Please refer to the Current Medication list given to you today.      Thank you for choosing [facility] !        

## 2016-04-09 NOTE — Progress Notes (Signed)
CARDIOLOGY CONSULT NOTE  Patient ID: Toni Olsen MRN: [id number] DOB/AGE: 1965/10/25 50 y.o.  Admit date: (Not on file) Primary Physician: Vic Blackbird, MD Referring Physician:   Reason for Consultation: PAF  HPI: 50 year old woman with hypertension, diabetes, and hyperlipidemia referred for the management of paroxysmal atrial fibrillation. ECG 02/27/16 showed normal sinus rhythm.  She was first diagnosed with atrial fibrillation in January 2014. Since then, she has not had any documented recurrences. She taken herself off of metoprolol. In mid-August she was awoken by palpitations but felt it was due to a panic attack due to significant anxiety and stress. She then saw her PCP 2 days later and was started back on metoprolol.  She denies exertional chest pain and has chronic exertional dyspnea due to obesity. This has not gotten worse. She is trying to eat better and exercise to lose weight.  Allergies  Allergen Reactions  . Penicillins Rash  . Sulfa Antibiotics Rash    Current Outpatient Prescriptions  Medication Sig Dispense Refill  . Blood Glucose Monitoring Suppl (BLOOD GLUCOSE SYSTEM PAK) KIT Please dispense based on patient and insurance preference. Use as directed to monitor FSBS 2x daily. Dx: E11.9 1 each 1  . empagliflozin (JARDIANCE) 10 MG TABS tablet Take 10 mg by mouth daily. 30 tablet 3  . Glucose Blood (BLOOD GLUCOSE TEST STRIPS) STRP Please dispense based on patient and insurance preference. Use as directed to monitor FSBS 2x daily. Dx: E11.9 100 each 1  . Lancet Devices MISC Please dispense based on patient and insurance preference. Use as directed to monitor FSBS 2x daily. Dx: E11.9 1 each 1  . Lancets MISC Please dispense based on patient and insurance preference. Use as directed to monitor FSBS 2x daily. Dx: E11.9 100 each 0  . metoprolol succinate (TOPROL-XL) 25 MG 24 hr tablet Take 1 tablet (25 mg total) by mouth daily. 30 tablet 3   No current  facility-administered medications for this visit.     Past Medical History:  Diagnosis Date  . Atrial fibrillation ([place])    isolated - setting of decongestant & stimulant use  . Diabetes mellitus without complication ([place])    type 2  . Hypertension   . Obesity     Past Surgical History:  Procedure Laterality Date  . ABDOMINAL HYSTERECTOMY  1995  . CESAREAN SECTION  1990  . CHOLECYSTECTOMY  2002  . NM MYOCAR PERF WALL MOTION  04/2007   bruce myoview; normal pattern of perfusion to all regions; post-stress EF 70%; normal, low risk scan   . Sleep Study  06/2004   mild OSA/hypopnea syndrome  . TRANSTHORACIC ECHOCARDIOGRAM  04/2007   EF=>55%, mild asymmetric LVH; trace MR/TR    Social History   Social History  . Marital status: Divorced    Spouse name: N/A  . Number of children: 1  . Years of education: 53   Occupational History  . CNA Self-Employed   Social History Main Topics  . Smoking status: Never Smoker  . Smokeless tobacco: Never Used  . Alcohol use 0.0 oz/week     Comment: occasional  . Drug use: No  . Sexual activity: Yes   Other Topics Concern  . Not on file   Social History Narrative  . No narrative on file     No family history of premature CAD in 1st degree relatives.  Prior to Admission medications   Medication Sig Start Date End Date Taking? Authorizing Provider  Blood  Glucose Monitoring Suppl (BLOOD GLUCOSE SYSTEM PAK) KIT Please dispense based on patient and insurance preference. Use as directed to monitor FSBS 2x daily. Dx: E11.9 02/27/16   Alycia Rossetti, MD  cephALEXin (KEFLEX) 500 MG capsule Take 1 capsule (500 mg total) by mouth 2 (two) times daily. 03/02/16   Alycia Rossetti, MD  empagliflozin (JARDIANCE) 10 MG TABS tablet Take 10 mg by mouth daily. 03/10/16   Alycia Rossetti, MD  Glucose Blood (BLOOD GLUCOSE TEST STRIPS) STRP Please dispense based on patient and insurance preference. Use as directed to monitor FSBS 2x daily. Dx: E11.9  02/27/16   Alycia Rossetti, MD  Lancet Devices MISC Please dispense based on patient and insurance preference. Use as directed to monitor FSBS 2x daily. Dx: E11.9 02/27/16   Alycia Rossetti, MD  Lancets MISC Please dispense based on patient and insurance preference. Use as directed to monitor FSBS 2x daily. Dx: E11.9 02/27/16   Alycia Rossetti, MD  metoprolol succinate (TOPROL-XL) 25 MG 24 hr tablet Take 1 tablet (25 mg total) by mouth daily. 02/27/16   Alycia Rossetti, MD  sitaGLIPtin (JANUVIA) 100 MG tablet Take 1 tablet (100 mg total) by mouth daily. 05/16/15   Alycia Rossetti, MD     Review of systems complete and found to be negative unless listed above in HPI     Physical exam Blood pressure 126/80, pulse 90, height 5' 7" (1.702 m), weight 261 lb (118.4 kg), SpO2 96 %. General: NAD Neck: No JVD, no thyromegaly or thyroid nodule.  Lungs: Clear to auscultation bilaterally with normal respiratory effort. CV: Nondisplaced PMI. Regular rate and rhythm, normal S1/S2, no S3/S4, no murmur.  No peripheral edema.  No carotid bruit.   Abdomen: Soft, nontender, obese, no distention.  Skin: Intact without lesions or rashes.  Neurologic: Alert and oriented x 3.  Psych: Normal affect. Extremities: No clubbing or cyanosis.  HEENT: Normal.   ECG: Most recent ECG reviewed.  Labs:   Lab Results  Component Value Date   WBC 6.2 02/27/2016   HGB 14.0 02/27/2016   HCT 43.3 02/27/2016   MCV 82.3 02/27/2016   PLT 316 02/27/2016   No results for input(s): NA, K, CL, CO2, BUN, CREATININE, CALCIUM, PROT, BILITOT, ALKPHOS, ALT, AST, GLUCOSE in the last 168 hours.  Invalid input(s): LABALBU Lab Results  Component Value Date   TROPONINI <0.30 07/30/2012    Lab Results  Component Value Date   CHOL 229 (H) 02/27/2016   CHOL 204 (H) 05/12/2015   CHOL 196 12/26/2013   Lab Results  Component Value Date   HDL 57 02/27/2016   HDL 46 05/12/2015   HDL 50 12/26/2013   Lab Results  Component  Value Date   LDLCALC 130 (H) 02/27/2016   LDLCALC 118 05/12/2015   LDLCALC 112 (H) 12/26/2013   Lab Results  Component Value Date   TRIG 212 (H) 02/27/2016   TRIG 202 (H) 05/12/2015   TRIG 171 (H) 12/26/2013   Lab Results  Component Value Date   CHOLHDL 4.0 02/27/2016   CHOLHDL 4.4 05/12/2015   CHOLHDL 3.9 12/26/2013   No results found for: LDLDIRECT       Studies: No results found.  ASSESSMENT AND PLAN:  1. PAF: No documented recurrences since 07/2012. CHADSVASC score of 3, thus anticoagulation warranted if she indeed has a fib. If she were to have recurrent palpitations and monitoring/ECG demonstrated a fib, I would start Xarelto 20 mg daily. Continue  metoprolol.  2. HTN: Controlled. No changes. Consider ACEI/ARB given concomitant h/o diabetes.  3. Hyperlipidemia: Currently pursuing dietary and exercise modification.  Dispo: fu 6 months.   Signed: Kate Sable, M.D., F.A.C.C.  04/09/2016, 10:47 AM

## 2016-06-18 ENCOUNTER — Ambulatory Visit: Payer: PRIVATE HEALTH INSURANCE | Admitting: Family Medicine

## 2016-07-13 ENCOUNTER — Ambulatory Visit: Payer: PRIVATE HEALTH INSURANCE | Admitting: Orthopaedic Surgery

## 2016-07-14 ENCOUNTER — Ambulatory Visit: Payer: PRIVATE HEALTH INSURANCE | Admitting: Orthopaedic Surgery

## 2016-10-04 ENCOUNTER — Ambulatory Visit (INDEPENDENT_AMBULATORY_CARE_PROVIDER_SITE_OTHER): Payer: PRIVATE HEALTH INSURANCE

## 2016-10-04 ENCOUNTER — Encounter: Payer: Self-pay | Admitting: Orthopedic Surgery

## 2016-10-04 ENCOUNTER — Ambulatory Visit (INDEPENDENT_AMBULATORY_CARE_PROVIDER_SITE_OTHER): Payer: PRIVATE HEALTH INSURANCE | Admitting: Orthopedic Surgery

## 2016-10-04 VITALS — BP 166/99 | HR 77 | Ht 64.0 in | Wt 260.0 lb

## 2016-10-04 DIAGNOSIS — M25561 Pain in right knee: Secondary | ICD-10-CM | POA: Diagnosis not present

## 2016-10-04 DIAGNOSIS — M170 Bilateral primary osteoarthritis of knee: Secondary | ICD-10-CM | POA: Diagnosis not present

## 2016-10-04 DIAGNOSIS — M25562 Pain in left knee: Secondary | ICD-10-CM | POA: Diagnosis not present

## 2016-10-04 MED ORDER — MELOXICAM 7.5 MG PO TABS: 7.5000 mg | ORAL_TABLET | Freq: Every day | ORAL | 5 refills | Status: DC

## 2016-10-04 NOTE — Progress Notes (Signed)
Patient ID: Toni Olsen, female   DOB: August 23, 1965, 51 y.o.   MRN: [id number]  Chief Complaint  Patient presents with  . Knee Pain    BILATERAL KNEE PAIN    This is a 51 year old female referred to us by Dr. Jeanice Olsen with a history of diabetes and a BMI of 46 presents with this 2 year history of increasing knee pain over the medial aspects of both knees with no history of trauma. She did take ibuprofen and Advil without any relief. She's not had any other treatment. 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  Neurological: Negative for tingling and weakness.    Past Medical History:  Diagnosis Date  . Atrial fibrillation (HCC)    isolated - setting of decongestant & stimulant use  . Diabetes mellitus without complication ([facility])    type 2  . Hypertension   . Obesity     Past Surgical History:  Procedure Laterality Date  . ABDOMINAL HYSTERECTOMY  1995  . CESAREAN SECTION  1990  . CHOLECYSTECTOMY  2002  . NM MYOCAR PERF WALL MOTION  04/2007   bruce myoview; normal pattern of perfusion to all regions; post-stress EF 70%; normal, low risk scan   . Sleep Study  06/2004   mild OSA/hypopnea syndrome  . TRANSTHORACIC ECHOCARDIOGRAM  04/2007   EF=>55%, mild asymmetric LVH; trace MR/TR    PHYSICAL EXAM  BP (!) 166/99   Pulse 77   Ht 5\' 4"  (1.626 m)   Wt 260 lb (117.9 kg)   BMI 44.63 kg/m  GENERAL appearance reveals no gross abnormalities, normal development grooming and hygiene   MENTAL STATUS we note that the patient is awake alert and oriented to person place and time MOOD/AFFECT ARE NORMAL   GAIT reveals NO limp   EXAM OF THE RIGHT  KNEE SKIN no erythema lacerations or ecchymosis  INSPECTION  tenderness over the MEDIAL  joint line and NO joint effusion  ROM is 120 STABILITY tests for the acl (anterior drawer) and pcl posterior drawer are  normal. the collateral ligaments are stable with varus valgus stress testing  MOTOR GRADE 5/5 in the quad muscles    examination of the  LEFT knee  We see no abnormalities in the skin. On inspection there is tenderness over the medial joint line without effusion. Her range of motion is 120  Stability tests for ligament integrity are normal  Motor exam is 5 out of 5 strength in the quads    right and left leg:   VASC 2+ dorsalis pedis pulse normal capillary refill excellent warmth to the extremity    NEURO normal sensation and no pathologic reflexes    LYMPH deferred noncontributory   IMAGING STUDIES  I have independently reviewed the x-rays and I interpreted the x-rays as follows:  I ordered 2 sets of films AP and lateral and patellofemoral views of both knees  She has severe arthritis with varus alignment both knees with bone-on-bone contact of the medial compartment  Dx   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

## 2016-10-04 NOTE — Patient Instructions (Addendum)
You have received an injection of steroids into the joint. 15% of patients will have increased pain within the 24 hours postinjection.   This is transient and will go away.   We recommend that you use ice packs on the injection site for 20 minutes every 2 hours and extra strength Tylenol 2 tablets every 8 as needed until the pain resolves.  If you continue to have pain after taking the Tylenol and using the ice please call the office for further instructions.  Total Knee Replacement Total knee replacement is a surgery to replace your knee joint with a man-made (prosthetic) joint. The man-made joint is called a prosthesis. It replaces parts of the thigh bone (femur), lower leg bone (tibia), and kneecap (patella). This surgery is done to lessen pain and improve knee movement. What happens before the procedure?  Ask your doctor about:  Changing or stopping your normal medicines. This is especially important if you take diabetes medicines or blood thinners.  Taking medicines such as aspirin and ibuprofen. These medicines can thin your blood. Do not take these medicines before your procedure if your doctor tells you not to.  Get all dental care that you need done before your procedure. Plan to not have dental work done for 3 months after your surgery.  Follow instructions from your doctor about what you cannot eat or drink.  Ask your doctor how your surgical site will be marked or identified.  You may be given antibiotic medicine to help prevent infection.  If your doctor prescribes physical therapy, do exercises as told.  Do not use any tobacco products, such as cigarettes, chewing tobacco, or e-cigarettes. If you need help quitting, ask your doctor.  You may have a physical exam.  You may have tests, such as:  X-rays.  MRI.  CT scan.  Bone scans.  You may have a blood or urine sample taken.  Plan to have someone take you home after the procedure.  If you will be going home  right after the procedure, plan to have someone with you for at least 24 hours. It is best to have someone help care for you for at least 4-6 weeks after surgery. What happens during the procedure?  To reduce your risk of infection:  Your health care team will wash or sanitize their hands.  Your skin will be washed with soap.  An IV tube will be put into one of your veins.  You will be given one or more of the following:  Sedative. This is a medicine that makes you relaxed.  Local anesthetic. This is a medicine to numb the area.  General anesthetic. This is a medicine that makes you fall asleep.  Spinal anesthetic. This is a medicine that numbs your body below the waist.  Regional anesthetic. This is a medicine that numbs everything below the injection site.  A cut (incision) will be made in your knee.  Damaged parts of your thigh bone, lower leg bone, and kneecap will be removed.  A piece of metal (liner) will be placed on your thigh bone. Pieces of plastic will be placed on your lower leg bone and the underside of your kneecap.  One or more small tubes (drains) may be placed near your cut to help drain fluid.  Your cut will be closed with stitches (sutures), skin glue, or skin tape (adhesive) strips. Medicine may be put on your cut.  A bandage (dressing) will be placed over your cut. The procedure may   vary among doctors and hospitals. What happens after the procedure?  Your blood pressure, heart rate, breathing rate, and blood oxygen level will be monitored often until the medicines you were given have worn off.  You may continue to get fluids and medicines through an IV tube.  You will have some pain. There will be medicines to help you.  You may have fluid coming from a drain.  You may have to wear special socks (compression stockings). These help to prevent blood clots and reduce swelling in your legs.  You will be told to move around as much as possible.  You  may be given a continuous passive motion machine to use at home. You will be shown how to use this machine.  Do not drive for 24 hours if you received a sedative. This information is not intended to replace advice given to you by your health care provider. Make sure you discuss any questions you have with your health care provider. Document Released: 09/20/2011 Document Revised: 03/01/2016 Document Reviewed: 06/04/2015 Elsevier Interactive Patient Education  2017 Elsevier Inc.  Arthritis Arthritis is a term that is commonly used to refer to joint pain or joint disease. There are more than 100 types of arthritis. What are the causes? The most common cause of this condition is wear and tear of a joint. Other causes include:  Gout.  Inflammation of a joint.  An infection of a joint.  Sprains and other injuries near the joint.  A drug reaction or allergic reaction. In some cases, the cause may not be known. What are the signs or symptoms? The main symptom of this condition is pain in the joint with movement. Other symptoms include:  Redness, swelling, or stiffness at a joint.  Warmth coming from the joint.  Fever.  Overall feeling of illness. How is this diagnosed? This condition may be diagnosed with a physical exam and tests, including:  Blood tests.  Urine tests.  Imaging tests, such as MRI, X-rays, or a CT scan. Sometimes, fluid is removed from a joint for testing. How is this treated? Treatment for this condition may involve:  Treatment of the cause, if it is known.  Rest.  Raising (elevating) the joint.  Applying cold or hot packs to the joint.  Medicines to improve symptoms and reduce inflammation.  Injections of a steroid such as cortisone into the joint to help reduce pain and inflammation. Depending on the cause of your arthritis, you may need to make lifestyle changes to reduce stress on your joint. These changes may include exercising more and losing  weight. Follow these instructions at home: Medicines   Take over-the-counter and prescription medicines only as told by your health care provider.  Do not take aspirin to relieve pain if gout is suspected. Activity   Rest your joint if told by your health care provider. Rest is important when your disease is active and your joint feels painful, swollen, or stiff.  Avoid activities that make the pain worse. It is important to balance activity with rest.  Exercise your joint regularly with range-of-motion exercises as told by your health care provider. Try doing low-impact exercise, such as:  Swimming.  Water aerobics.  Biking.  Walking. Joint Care    If your joint is swollen, keep it elevated if told by your health care provider.  If your joint feels stiff in the morning, try taking a warm shower.  If directed, apply heat to the joint. If you have diabetes, do   not apply heat without permission from your health care provider.  Put a towel between the joint and the hot pack or heating pad.  Leave the heat on the area for 20-30 minutes.  If directed, apply ice to the joint:  Put ice in a plastic bag.  Place a towel between your skin and the bag.  Leave the ice on for 20 minutes, 2-3 times per day.  Keep all follow-up visits as told by your health care provider. This is important. Contact a health care provider if:  The pain gets worse.  You have a fever. Get help right away if:  You develop severe joint pain, swelling, or redness.  Many joints become painful and swollen.  You develop severe back pain.  You develop severe weakness in your leg.  You cannot control your bladder or bowels. This information is not intended to replace advice given to you by your health care provider. Make sure you discuss any questions you have with your health care provider. Document Released: 08/05/2004 Document Revised: 12/04/2015 Document Reviewed: 09/23/2014 Elsevier  Interactive Patient Education  2017 Elsevier Inc.  

## 2016-10-20 ENCOUNTER — Encounter: Payer: Self-pay | Admitting: Family Medicine

## 2016-11-15 ENCOUNTER — Encounter: Payer: Self-pay | Admitting: Family Medicine

## 2017-01-25 ENCOUNTER — Encounter: Payer: Self-pay | Admitting: Family Medicine

## 2017-04-04 ENCOUNTER — Ambulatory Visit: Payer: PRIVATE HEALTH INSURANCE | Admitting: Orthopedic Surgery

## 2017-05-24 ENCOUNTER — Other Ambulatory Visit: Payer: Self-pay | Admitting: Family Medicine

## 2017-05-24 DIAGNOSIS — Z1231 Encounter for screening mammogram for malignant neoplasm of breast: Secondary | ICD-10-CM

## 2017-05-26 ENCOUNTER — Encounter (HOSPITAL_COMMUNITY): Payer: Self-pay

## 2017-05-26 ENCOUNTER — Ambulatory Visit (HOSPITAL_COMMUNITY)
Admission: RE | Admit: 2017-05-26 | Discharge: 2017-05-26 | Disposition: A | Discharge status: Home / Self Care | Payer: PRIVATE HEALTH INSURANCE | Source: Ambulatory Visit | Attending: Family Medicine | Admitting: Family Medicine

## 2017-05-26 DIAGNOSIS — Z1231 Encounter for screening mammogram for malignant neoplasm of breast: Secondary | ICD-10-CM | POA: Diagnosis present

## 2017-06-05 NOTE — Progress Notes (Signed)
Cardiology Office Note   Date:  06/06/2017   ID:  Toni Olsen, DOB 01-04-1966, MRN [id number]  PCP:  Alycia Rossetti, MD  Cardiologist: Kate Sable, MD    History of Present Illness: Toni Olsen is a 51 y.o. female who presents for ongoing assessment and management of paroxysmal atrial fibrillation (in the setting of decongestant and steroid use), with other history to include HTN, Type II Diabetes, and Hyperlipidemia. She was not on anticoagulation therapy on office visit 04/09/2016, and was in NSR. If she were to have recurrent palpitations, Xarelto would be considered,   The patient states that she continues to have palpitations.  But on interview prior to being seen in the clinic by Margaretha Glassing, [place], she was asked if she drank caffeinated sodas, or [facility].  The patient admitted that she drank a sixpack or more a day but was trying to wean down.  Patient suddenly realized that the caffeine in the [facility] was causing her rapid heart rhythm.  The patient states that she often has a [facility] before going to bed, and awakens at night sometimes with racing heart rate.  EKG today in the office revealed normal sinus rhythm heart rate of 85 bpm, with nonspecific T wave abnormality in the lateral leads.  Past Medical History:  Diagnosis Date  . Atrial fibrillation ([place])    isolated - setting of decongestant & stimulant use  . Diabetes mellitus without complication ([place])    type 2  . Hypertension   . Obesity     Past Surgical History:  Procedure Laterality Date  . ABDOMINAL HYSTERECTOMY  1995  . CESAREAN SECTION  1990  . CHOLECYSTECTOMY  2002  . NM MYOCAR PERF WALL MOTION  04/2007   bruce myoview; normal pattern of perfusion to all regions; post-stress EF 70%; normal, low risk scan   . Sleep Study  06/2004   mild OSA/hypopnea syndrome  . TRANSTHORACIC ECHOCARDIOGRAM  04/2007   EF=>55%, mild asymmetric LVH; trace MR/TR     Current Outpatient Medications    Medication Sig Dispense Refill  . Blood Glucose Monitoring Suppl (BLOOD GLUCOSE SYSTEM PAK) KIT Please dispense based on patient and insurance preference. Use as directed to monitor FSBS 2x daily. Dx: E11.9 1 each 1  . empagliflozin (JARDIANCE) 10 MG TABS tablet Take 10 mg by mouth daily. 30 tablet 3  . Glucose Blood (BLOOD GLUCOSE TEST STRIPS) STRP Please dispense based on patient and insurance preference. Use as directed to monitor FSBS 2x daily. Dx: E11.9 100 each 1  . Lancet Devices MISC Please dispense based on patient and insurance preference. Use as directed to monitor FSBS 2x daily. Dx: E11.9 1 each 1  . Lancets MISC Please dispense based on patient and insurance preference. Use as directed to monitor FSBS 2x daily. Dx: E11.9 100 each 0  . meloxicam (MOBIC) 7.5 MG tablet Take 1 tablet (7.5 mg total) by mouth daily. 30 tablet 5  . metoprolol succinate (TOPROL-XL) 25 MG 24 hr tablet Take 1 tablet (25 mg total) by mouth daily. 30 tablet 3   No current facility-administered medications for this visit.     Allergies:   Penicillins and Sulfa antibiotics    Social History:  The patient  reports that  has never smoked. she has never used smokeless tobacco. She reports that she drinks alcohol. She reports that she does not use drugs.   Family History:  The patient's family history includes Breast cancer (age of onset:  54) in her mother; Diabetes in her father; Healthy in her son; Hypertension in her brother; Kidney disease in her mother; Renal cancer in her sister; Sickle cell trait in her sister.    ROS: All other systems are reviewed and negative. Unless otherwise mentioned in H&P    PHYSICAL EXAM: VS:  BP 128/70 (BP Location: Right Arm)   Pulse 88   Ht _0  (1.626 m)   Wt 258 lb (117 kg)   SpO2 95%   BMI 44.29 kg/m  , BMI Body mass index is 44.29 kg/m. GEN: Well nourished, well developed, in no acute distress base HEENT: normal  Neck: no JVD, carotid bruits, or masses Cardiac:  RRR; no murmurs, rubs, or gallops,no edema  Respiratory:  clear to auscultation bilaterally, normal work of breathing GI: soft, nontender, nondistended, + BS MS: no deformity or atrophy  Skin: warm and dry, no rash Neuro:  Strength and sensation are intact Psych: euthymic mood, full affect   EKG:  The ekg ordered today demonstrates sinus rhythm heart rate of 85 bpm with nonspecific T wave flattening noted in the lateral leads.  Recent Labs: No results found for requested labs within last 8760 hours.    Lipid Panel    Component Value Date/Time   CHOL 229 (H) 02/27/2016 1257   TRIG 212 (H) 02/27/2016 1257   HDL 57 02/27/2016 1257   CHOLHDL 4.0 02/27/2016 1257   VLDL 42 (H) 02/27/2016 1257   LDLCALC 130 (H) 02/27/2016 1257      Wt Readings from Last 3 Encounters:  06/06/17 258 lb (117 kg)  10/04/16 260 lb (117.9 kg)  04/09/16 261 lb (118.4 kg)      Other studies Reviewed:   ASSESSMENT AND PLAN:  1.  Rapid heart rhythm: Likely from self-admitted high caffeine use.  Drinking greater than a 6 pack of [facility] a day along with coffee in the morning.  Patient is advised to wean from [facility], and avoid caffeinated soft drinks.  The patient has not had an echocardiogram since 2006.  We will consider repeating this on follow-up visit.  2.  Hypertension: The patient is currently on metoprolol 25 mg daily.  May need to consider ACE inhibitor for renal protection on follow-up.  Currently her blood pressure is 128/70.  3.  Morbid obesity: She is advised that by discontinuation of sugary sodas and caffeine she may begin to lose some weight.  This will also help with diabetic control.  4.  Diabetes: Continue with PCP for ongoing management.  Current medicines are reviewed at length with the patient today.    Labs/ tests ordered today include:   Phill Myron. West Pugh, ANP, AACC   06/06/2017 4:06 PM    [facility] [address], [place],  [place] [address] Phone: [phone]; Fax: [phone]

## 2017-06-06 ENCOUNTER — Encounter: Payer: Self-pay | Admitting: Adult Health

## 2017-06-06 ENCOUNTER — Ambulatory Visit (INDEPENDENT_AMBULATORY_CARE_PROVIDER_SITE_OTHER): Payer: PRIVATE HEALTH INSURANCE | Admitting: Adult Health

## 2017-06-06 VITALS — BP 128/70 | HR 88 | Ht 64.0 in | Wt 258.0 lb

## 2017-06-06 DIAGNOSIS — I10 Essential (primary) hypertension: Secondary | ICD-10-CM

## 2017-06-06 DIAGNOSIS — I480 Paroxysmal atrial fibrillation: Secondary | ICD-10-CM | POA: Diagnosis not present

## 2017-06-06 NOTE — Patient Instructions (Signed)

## 2017-06-07 NOTE — Addendum Note (Signed)
Addended by: Abelino DerrickMCGHEE, Sahira Cataldi R on: 06/07/2017 07:11 AM   Modules accepted: Orders

## 2017-06-14 ENCOUNTER — Ambulatory Visit (HOSPITAL_COMMUNITY)
Admission: RE | Admit: 2017-06-14 | Discharge: 2017-06-14 | Disposition: A | Discharge status: Home / Self Care | Payer: No Typology Code available for payment source | Source: Ambulatory Visit | Attending: Adult Health | Admitting: Adult Health

## 2017-06-14 DIAGNOSIS — I119 Hypertensive heart disease without heart failure: Secondary | ICD-10-CM | POA: Insufficient documentation

## 2017-06-14 DIAGNOSIS — I480 Paroxysmal atrial fibrillation: Secondary | ICD-10-CM | POA: Diagnosis present

## 2017-06-14 DIAGNOSIS — E785 Hyperlipidemia, unspecified: Secondary | ICD-10-CM | POA: Insufficient documentation

## 2017-06-14 DIAGNOSIS — E119 Type 2 diabetes mellitus without complications: Secondary | ICD-10-CM | POA: Insufficient documentation

## 2017-06-14 NOTE — Progress Notes (Signed)
*  PRELIMINARY RESULTS* Echocardiogram 2D Echocardiogram has been performed.  Jeryl Columbialliott, Xavious Sharrar 06/14/2017, 3:17 PM

## 2017-06-15 ENCOUNTER — Telehealth: Payer: Self-pay | Admitting: Adult Health

## 2017-06-15 NOTE — Telephone Encounter (Signed)
Pt has a couple of questions concerning her Echo results [phone]

## 2017-06-15 NOTE — Telephone Encounter (Signed)
Appt made to review echo results in detail on 06/16/17 @ 2:30

## 2017-06-16 ENCOUNTER — Ambulatory Visit (INDEPENDENT_AMBULATORY_CARE_PROVIDER_SITE_OTHER): Payer: PRIVATE HEALTH INSURANCE | Admitting: Adult Health

## 2017-06-16 ENCOUNTER — Encounter: Payer: Self-pay | Admitting: Adult Health

## 2017-06-16 VITALS — BP 116/68 | HR 93 | Ht 64.0 in | Wt 254.0 lb

## 2017-06-16 DIAGNOSIS — I480 Paroxysmal atrial fibrillation: Secondary | ICD-10-CM | POA: Diagnosis not present

## 2017-06-16 MED ORDER — METOPROLOL SUCCINATE ER 25 MG PO TB24: 25.0000 mg | ORAL_TABLET | Freq: Every day | ORAL | 3 refills | Status: DC

## 2017-06-16 NOTE — Patient Instructions (Signed)
Medication Instructions:  Your physician recommends that you continue on your current medications as directed. Please refer to the Current Medication list given to you today.  Take Toprol XL 25 mg Daily   Labwork: NONE   Testing/Procedures: NONE   Follow-Up: Your physician wants you to follow-up in: 6 Months.  You will receive a reminder letter in the mail two months in advance. If you don't receive a letter, please call our office to schedule the follow-up appointment.   Any Other Special Instructions Will Be Listed Below (If Applicable).     If you need a refill on your cardiac medications before your next appointment, please call your pharmacy.  Thank you for choosing [facility]!

## 2017-06-16 NOTE — Addendum Note (Signed)
Addended by: Kerney ElbePINNIX, Chaniya Genter G on: 06/16/2017 03:02 PM   Modules accepted: Orders

## 2017-06-16 NOTE — Progress Notes (Signed)
Cardiology Office Note   Date:  06/16/2017   ID:  Toni Olsen, DOB Mar 26, 1966, MRN [id number]  PCP:  Toni Rossetti, MD  Cardiologist:  Toni Sable, MD No chief complaint on file.    History of Present Illness: Toni Olsen is a 51 y.o. female who presents for ongoing assessment and management of paroxysmal atrial fibrillation, hypertension, type 2 diabetes, and hyperlipidemia.  On last visits patient continued to have palpitations and was admitting to significant caffeine use.  Drinking several [employer] a day.  Advised on limiting caffeine.  Echocardiogram was ordered in the setting of rapid heart rhythm having not had one completed since 2006.  Left ventricle: The cavity size was normal. Wall thickness was   increased in a pattern of mild LVH. Systolic function was   vigorous. The estimated ejection fraction was in the range of 65%   to 70%. Wall motion was normal; there were no regional wall   motion abnormalities. There was an increased relative   contribution of atrial contraction to ventricular filling.  Was continued on metoprolol 25 mg daily.  In the setting of diabetes addition of ACE inhibitor would be considered on follow-up visit if blood pressure could tolerate this.  Today she states she has been off of [facility] for 2 weeks.  But she admits to not taking her metoprolol as directed and has not been taking her metoprolol for months.  States she is sleeping better off the excessive amount of caffeine.  Past Medical History:  Diagnosis Date  . Atrial fibrillation ([place])    isolated - setting of decongestant & stimulant use  . Diabetes mellitus without complication ([place])    type 2  . Hypertension   . Obesity     Past Surgical History:  Procedure Laterality Date  . ABDOMINAL HYSTERECTOMY  1995  . CESAREAN SECTION  1990  . CHOLECYSTECTOMY  2002  . NM MYOCAR PERF WALL MOTION  04/2007   bruce myoview; normal pattern of perfusion to all regions;  post-stress EF 70%; normal, low risk scan   . Sleep Study  06/2004   mild OSA/hypopnea syndrome  . TRANSTHORACIC ECHOCARDIOGRAM  04/2007   EF=>55%, mild asymmetric LVH; trace MR/TR     Current Outpatient Medications  Medication Sig Dispense Refill  . Blood Glucose Monitoring Suppl (BLOOD GLUCOSE SYSTEM PAK) KIT Please dispense based on patient and insurance preference. Use as directed to monitor FSBS 2x daily. Dx: E11.9 1 each 1  . empagliflozin (JARDIANCE) 10 MG TABS tablet Take 10 mg by mouth daily. 30 tablet 3  . Glucose Blood (BLOOD GLUCOSE TEST STRIPS) STRP Please dispense based on patient and insurance preference. Use as directed to monitor FSBS 2x daily. Dx: E11.9 100 each 1  . Lancet Devices MISC Please dispense based on patient and insurance preference. Use as directed to monitor FSBS 2x daily. Dx: E11.9 1 each 1  . Lancets MISC Please dispense based on patient and insurance preference. Use as directed to monitor FSBS 2x daily. Dx: E11.9 100 each 0  . meloxicam (MOBIC) 7.5 MG tablet Take 1 tablet (7.5 mg total) by mouth daily. 30 tablet 5  . metoprolol succinate (TOPROL-XL) 25 MG 24 hr tablet Take 1 tablet (25 mg total) by mouth daily. 30 tablet 3   No current facility-administered medications for this visit.     Allergies:   Penicillins and Sulfa antibiotics    Social History:  The patient  reports that  has never smoked.  she has never used smokeless tobacco. She reports that she drinks alcohol. She reports that she does not use drugs.   Family History:  The patient's family history includes Breast cancer (age of onset: 62) in her mother; Diabetes in her father; Healthy in her son; Hypertension in her brother; Kidney disease in her mother; Renal cancer in her sister; Sickle cell trait in her sister.    ROS: All other systems are reviewed and negative. Unless otherwise mentioned in H&P    PHYSICAL EXAM: VS:  BP 116/68 (BP Location: Left Arm)   Pulse 93   Ht '5\' 4"'  (1.626 m)    Wt 254 lb (115.2 kg)   SpO2 97%   BMI 43.60 kg/m  , BMI Body mass index is 43.6 kg/m. GEN: Well nourished, well developed, in no acute distress  HEENT: normal  Neck: no JVD, carotid bruits, or masses Cardiac: RRR; no murmurs, rubs, or gallops,no edema  Respiratory:  clear to auscultation bilaterally, normal work of breathing GI: soft, nontender, nondistended, + BS MS: no deformity or atrophy  Skin: warm and dry, no rash Neuro:  Strength and sensation are intact Psych: euthymic mood, full affect  Recent Labs: No results found for requested labs within last 8760 hours.    Lipid Panel    Component Value Date/Time   CHOL 229 (H) 02/27/2016 1257   TRIG 212 (H) 02/27/2016 1257   HDL 57 02/27/2016 1257   CHOLHDL 4.0 02/27/2016 1257   VLDL 42 (H) 02/27/2016 1257   LDLCALC 130 (H) 02/27/2016 1257      Wt Readings from Last 3 Encounters:  06/16/17 254 lb (115.2 kg)  06/06/17 258 lb (117 kg)  10/04/16 260 lb (117.9 kg)     ASSESSMENT AND PLAN:  1.  Tachycardia with palpitations: The patient admits to not taking metoprolol as directed.  She is beginning to show some signs of LVH on echocardiogram.  This has been discussed with the patient at length answering multiple questions.  I will started back on metoprolol 25 mg XL which she will take at nighttime to avoid daytime fatigue.  Not start ACE inhibitor at this time and be discussed on follow-up visit.  2.  Obesity: Patient asked if she can get an exercise program.  I have encouraged her to do so.  [facility]  She plans on re-enrolling in the class.  I have advised her to do at least 30 minutes of cardio exercise daily, but not over strenuous.  To begin slowly.  She verbalizes understanding.  She is also lost from 260 pounds to 254 pounds by stopping the [facility].  I believe this reduction in sugar content has been helpful to her.  She will follow-up with her primary care physician for ongoing  management.  Current medicines are reviewed at length with the patient today.    Labs/ tests ordered today include:   Phill Myron. West Pugh, ANP, AACC   06/16/2017 2:53 PM    [facility] [address] [place], [place] [address] Phone: [phone]; Fax: [phone]

## 2017-11-29 ENCOUNTER — Telehealth: Payer: Self-pay | Admitting: Cardiovascular Disease

## 2017-11-29 MED ORDER — METOPROLOL SUCCINATE ER 25 MG PO TB24: 25.0000 mg | ORAL_TABLET | Freq: Every day | ORAL | 3 refills | Status: DC

## 2017-11-29 NOTE — Telephone Encounter (Signed)
Pt lost bottle of metoprolol, escribed new rx as requested

## 2017-11-29 NOTE — Telephone Encounter (Signed)
Pt has lost her Rx for her metoprolol succinate (TOPROL-XL) 25 MG 24 hr tablet [[id number]]  And will need another Rx sent to [facility] -- she's been w/o it since Friday

## 2018-01-19 ENCOUNTER — Ambulatory Visit (INDEPENDENT_AMBULATORY_CARE_PROVIDER_SITE_OTHER): Payer: No Typology Code available for payment source | Admitting: Cardiovascular Disease

## 2018-01-19 ENCOUNTER — Encounter: Payer: Self-pay | Admitting: Cardiovascular Disease

## 2018-01-19 VITALS — BP 142/86 | HR 68 | Ht 64.0 in | Wt 256.0 lb

## 2018-01-19 DIAGNOSIS — Z79899 Other long term (current) drug therapy: Secondary | ICD-10-CM | POA: Diagnosis not present

## 2018-01-19 DIAGNOSIS — I10 Essential (primary) hypertension: Secondary | ICD-10-CM

## 2018-01-19 DIAGNOSIS — R002 Palpitations: Secondary | ICD-10-CM | POA: Diagnosis not present

## 2018-01-19 DIAGNOSIS — E1165 Type 2 diabetes mellitus with hyperglycemia: Secondary | ICD-10-CM

## 2018-01-19 DIAGNOSIS — E782 Mixed hyperlipidemia: Secondary | ICD-10-CM | POA: Diagnosis not present

## 2018-01-19 DIAGNOSIS — I480 Paroxysmal atrial fibrillation: Secondary | ICD-10-CM

## 2018-01-19 NOTE — Patient Instructions (Signed)
Medication Instructions:  Your physician recommends that you continue on your current medications as directed. Please refer to the Current Medication list given to you today.   Labwork: TODAY    Testing/Procedures: NONE  Follow-Up: Your physician recommends that you schedule a follow-up appointment in: 3 MONTHS    Any Other Special Instructions Will Be Listed Below (If Applicable). You have been referred to ENDOCRINOLOGY (DR. NIDA)      If you need a refill on your cardiac medications before your next appointment, please call your pharmacy.

## 2018-01-19 NOTE — Progress Notes (Signed)
SUBJECTIVE: The patient presents to reestablish care with me.  I last saw her in September 2017.  She has a history of paroxysmal atrial fibrillation with no documented recurrences since January 2014. This was an isolated episode in the setting of decongestant and stimulant use.  I reviewed the echocardiogram performed on 06/14/2017 which demonstrated vigorous left ventricular systolic function, LVEF 65 to 70%, mild LVH, and normal regional wall motion.  She has been off of her diabetes medications for over a year.  She has not undergone any labs since August 2017.  At that time she had elevated lipids and elevated HbA1c.  She told me Vania Rea would cost her over $500 per month and she could not afford this.  For the past month she has been having some palpitations sporadically at night.  She used to drink six 20 ounce bottles of [employer] daily and now only drinks decaffeinated sugar-free [facility] on occasion.  She does drink one iced coffee daily.  She does not have a PCP.  She denies chest pain and leg swelling.     Review of Systems: As per "subjective", otherwise negative.  Allergies  Allergen Reactions  . Penicillins Rash  . Sulfa Antibiotics Rash    Current Outpatient Medications  Medication Sig Dispense Refill  . Blood Glucose Monitoring Suppl (BLOOD GLUCOSE SYSTEM PAK) KIT Please dispense based on patient and insurance preference. Use as directed to monitor FSBS 2x daily. Dx: E11.9 1 each 1  . empagliflozin (JARDIANCE) 10 MG TABS tablet Take 10 mg by mouth daily. 30 tablet 3  . Glucose Blood (BLOOD GLUCOSE TEST STRIPS) STRP Please dispense based on patient and insurance preference. Use as directed to monitor FSBS 2x daily. Dx: E11.9 100 each 1  . Lancet Devices MISC Please dispense based on patient and insurance preference. Use as directed to monitor FSBS 2x daily. Dx: E11.9 1 each 1  . Lancets MISC Please dispense based on patient and insurance preference.  Use as directed to monitor FSBS 2x daily. Dx: E11.9 100 each 0  . meloxicam (MOBIC) 7.5 MG tablet Take 1 tablet (7.5 mg total) by mouth daily. 30 tablet 5  . metoprolol succinate (TOPROL-XL) 25 MG 24 hr tablet Take 1 tablet (25 mg total) by mouth daily. 90 tablet 3   No current facility-administered medications for this visit.     Past Medical History:  Diagnosis Date  . Atrial fibrillation ([place])    isolated - setting of decongestant & stimulant use  . Diabetes mellitus without complication ([place])    type 2  . Hypertension   . Obesity     Past Surgical History:  Procedure Laterality Date  . ABDOMINAL HYSTERECTOMY  1995  . CESAREAN SECTION  1990  . CHOLECYSTECTOMY  2002  . NM MYOCAR PERF WALL MOTION  04/2007   bruce myoview; normal pattern of perfusion to all regions; post-stress EF 70%; normal, low risk scan   . Sleep Study  06/2004   mild OSA/hypopnea syndrome  . TRANSTHORACIC ECHOCARDIOGRAM  04/2007   EF=>55%, mild asymmetric LVH; trace MR/TR    Social History   Socioeconomic History  . Marital status: Divorced    Spouse name: Not on file  . Number of children: 1  . Years of education: 38  . Highest education level: Not on file  Occupational History  . Occupation: Forensic psychologist: SELF-EMPLOYED  Social Needs  . Financial resource strain: Not on file  . Food  insecurity:    Worry: Not on file    Inability: Not on file  . Transportation needs:    Medical: Not on file    Non-medical: Not on file  Tobacco Use  . Smoking status: Never Smoker  . Smokeless tobacco: Never Used  Substance and Sexual Activity  . Alcohol use: Yes    Alcohol/week: 0.0 oz    Comment: occasional  . Drug use: No  . Sexual activity: Yes  Lifestyle  . Physical activity:    Days per week: Not on file    Minutes per session: Not on file  . Stress: Not on file  Relationships  . Social connections:    Talks on phone: Not on file    Gets together: Not on file    Attends religious service:  Not on file    Active member of club or organization: Not on file    Attends meetings of clubs or organizations: Not on file    Relationship status: Not on file  . Intimate partner violence:    Fear of current or ex partner: Not on file    Emotionally abused: Not on file    Physically abused: Not on file    Forced sexual activity: Not on file  Other Topics Concern  . Not on file  Social History Narrative  . Not on file     Vitals:   01/19/18 1412  BP: (!) 142/86  Pulse: 68  SpO2: 97%  Weight: 256 lb (116.1 kg)  Height: '5\' 4"'  (1.626 m)    Wt Readings from Last 3 Encounters:  01/19/18 256 lb (116.1 kg)  06/16/17 254 lb (115.2 kg)  06/06/17 258 lb (117 kg)     PHYSICAL EXAM General: NAD HEENT: Normal. Neck: No JVD, no thyromegaly. Lungs: Clear to auscultation bilaterally with normal respiratory effort. CV: Regular rate and rhythm, normal S1/S2, no S3/S4, no murmur. No pretibial or periankle edema.  No carotid bruit.   Abdomen: Soft, nontender, no distention.  Neurologic: Alert and oriented.  Psych: Normal affect. Skin: Normal. Musculoskeletal: No gross deformities.    ECG: Reviewed above under Subjective   Labs: Lab Results  Component Value Date/Time   K 4.3 02/27/2016 12:57 PM   BUN 13 02/27/2016 12:57 PM   CREATININE 0.84 02/27/2016 12:57 PM   ALT 24 02/27/2016 12:57 PM   TSH 3.18 02/27/2016 12:57 PM   HGB 14.0 02/27/2016 12:57 PM     Lipids: Lab Results  Component Value Date/Time   LDLCALC 130 (H) 02/27/2016 12:57 PM   CHOL 229 (H) 02/27/2016 12:57 PM   TRIG 212 (H) 02/27/2016 12:57 PM   HDL 57 02/27/2016 12:57 PM       ASSESSMENT AND PLAN: 1.  Paroxysmal atrial fibrillation: This was an isolated instance in the setting of decongestant and stimulant use.  While she has had some palpitations, she has been off of all diabetic medications for the past year.  I will check TSH, CBC, basic metabolic panel, lipid panel, and HbA1c.  If she were to have  recurrent documented atrial fibrillation, I would initiate systemic anticoagulation as she has a CHADSVASC score of 3.  Continue Toprol-XL 25 mg daily.  2.  Hypertension: Blood pressure is mildly elevated.  She certainly needs weight loss.  She has whitecoat hypertension.  I will monitor this.  3.  Type 2 diabetes mellitus: As stated above, she has been off of all diabetes medications for over a year.  I will check HbA1c,  CBC, and basic metabolic panel along with lipids.  I will make a referral to endocrinology.  4.  Mixed dyslipidemia: Lipids were markedly elevated in August 2017.  Given her type 2 diabetes mellitus, she will require statin therapy as well.  I will check a lipid panel.    Disposition: Follow up 3 months.   Kate Sable, M.D., F.A.C.C.

## 2018-01-25 ENCOUNTER — Other Ambulatory Visit (HOSPITAL_COMMUNITY)
Admission: RE | Admit: 2018-01-25 | Discharge: 2018-01-25 | Disposition: A | Discharge status: Home / Self Care | Payer: No Typology Code available for payment source | Source: Ambulatory Visit | Attending: Cardiovascular Disease | Admitting: Cardiovascular Disease

## 2018-01-25 DIAGNOSIS — E785 Hyperlipidemia, unspecified: Secondary | ICD-10-CM | POA: Diagnosis present

## 2018-01-25 DIAGNOSIS — R002 Palpitations: Secondary | ICD-10-CM | POA: Diagnosis present

## 2018-01-25 DIAGNOSIS — Z794 Long term (current) use of insulin: Secondary | ICD-10-CM | POA: Insufficient documentation

## 2018-01-25 DIAGNOSIS — E118 Type 2 diabetes mellitus with unspecified complications: Secondary | ICD-10-CM | POA: Insufficient documentation

## 2018-01-25 DIAGNOSIS — Z79899 Other long term (current) drug therapy: Secondary | ICD-10-CM | POA: Diagnosis present

## 2018-01-25 LAB — CBC WITH DIFFERENTIAL/PLATELET
BASOS ABS: 0 10*3/uL (ref 0.0–0.1)
Basophils Relative: 0 %
EOS ABS: 0.2 10*3/uL (ref 0.0–0.7)
Eosinophils Relative: 3 %
HCT: 41.7 % (ref 36.0–46.0)
HEMOGLOBIN: 13.9 g/dL (ref 12.0–15.0)
Lymphocytes Relative: 36 %
Lymphs Abs: 2.5 10*3/uL (ref 0.7–4.0)
MCH: 27.7 pg (ref 26.0–34.0)
MCHC: 33.3 g/dL (ref 30.0–36.0)
MCV: 83.1 fL (ref 78.0–100.0)
Monocytes Absolute: 0.5 10*3/uL (ref 0.1–1.0)
Monocytes Relative: 7 %
NEUTROS PCT: 54 %
Neutro Abs: 3.6 10*3/uL (ref 1.7–7.7)
Platelets: 290 10*3/uL (ref 150–400)
RBC: 5.02 MIL/uL (ref 3.87–5.11)
RDW: 12.9 % (ref 11.5–15.5)
WBC: 6.8 10*3/uL (ref 4.0–10.5)

## 2018-01-25 LAB — LIPID PANEL
Cholesterol: 226 mg/dL — ABNORMAL HIGH (ref 0–200)
HDL: 49 mg/dL (ref >40)
LDL CALC: 114 mg/dL — AB (ref 0–99)
Total CHOL/HDL Ratio: 4.6 RATIO
Triglycerides: 314 mg/dL — ABNORMAL HIGH (ref <150)
VLDL: 63 mg/dL — ABNORMAL HIGH (ref 0–40)

## 2018-01-25 LAB — BASIC METABOLIC PANEL
ANION GAP: 9 (ref 5–15)
BUN: 15 mg/dL (ref 6–20)
CALCIUM: 10.3 mg/dL (ref 8.9–10.3)
CO2: 26 mmol/L (ref 22–32)
CREATININE: 0.8 mg/dL (ref 0.44–1.00)
Chloride: 99 mmol/L (ref 98–111)
Glucose, Bld: 298 mg/dL — ABNORMAL HIGH (ref 70–99)
Potassium: 4.1 mmol/L (ref 3.5–5.1)
SODIUM: 134 mmol/L — AB (ref 135–145)

## 2018-01-25 LAB — HEMOGLOBIN A1C
HEMOGLOBIN A1C: 11 % — AB (ref 4.8–5.6)
Mean Plasma Glucose: 269 mg/dL

## 2018-01-25 LAB — TSH: TSH: 2.384 u[IU]/mL (ref 0.350–4.500)

## 2018-01-27 ENCOUNTER — Telehealth: Payer: Self-pay

## 2018-01-27 DIAGNOSIS — E119 Type 2 diabetes mellitus without complications: Secondary | ICD-10-CM

## 2018-01-27 DIAGNOSIS — E782 Mixed hyperlipidemia: Secondary | ICD-10-CM

## 2018-01-27 MED ORDER — ROSUVASTATIN CALCIUM 20 MG PO TABS: 20.0000 mg | ORAL_TABLET | Freq: Every day | ORAL | 3 refills | Status: DC

## 2018-01-27 NOTE — Telephone Encounter (Signed)
-----   Message from Lesle ChrisAngela G Hill, LPN sent at 7/82/95627/18/2019  5:57 PM EDT -----   ----- Message ----- From: Norva PavlovJoyce, Kailey, LPN Sent: 1/30/86577/18/2019   3:17 PM To: Lesle ChrisAngela G Hill, LPN    ----- Message ----- From: Laqueta LindenKoneswaran, Suresh A, MD Sent: 01/26/2018   1:25 PM To: Norva PavlovKailey Joyce, LPN  Diabetes and lipids are very poorly controlled.  She needs to see endocrinology ASAP.  Start metformin 500 mg twice daily and Crestor 20 mg daily.  Repeat lipids in 4 months.

## 2018-01-27 NOTE — Telephone Encounter (Signed)
Pt intolerant to metformin, states it causes her severe diarrhea, is not willing to take. E-scribed Crestor and mailed lab slip.Ref placed to Dr.Nida

## 2018-02-03 ENCOUNTER — Encounter: Payer: Self-pay | Admitting: *Deleted

## 2018-02-03 NOTE — Progress Notes (Signed)
Patient walked into office stating that she can't take metformin stating it causes severe diarrhea. Prescription for metformin sent to pharmacy on 01/27/18. Patient never picked prescription up. Patient advised to retry metformin taking half dosage and use imodium prn if diarrhea occurs. Verbalized understanding.

## 2018-03-14 ENCOUNTER — Ambulatory Visit (INDEPENDENT_AMBULATORY_CARE_PROVIDER_SITE_OTHER): Payer: PRIVATE HEALTH INSURANCE | Admitting: "Endocrinology

## 2018-03-14 ENCOUNTER — Encounter: Payer: Self-pay | Admitting: "Endocrinology

## 2018-03-14 VITALS — BP 142/86 | HR 76 | Ht 64.0 in | Wt 251.0 lb

## 2018-03-14 DIAGNOSIS — I10 Essential (primary) hypertension: Secondary | ICD-10-CM

## 2018-03-14 DIAGNOSIS — E782 Mixed hyperlipidemia: Secondary | ICD-10-CM

## 2018-03-14 DIAGNOSIS — E1165 Type 2 diabetes mellitus with hyperglycemia: Secondary | ICD-10-CM | POA: Insufficient documentation

## 2018-03-14 MED ORDER — METFORMIN HCL 500 MG PO TABS: 500.0000 mg | ORAL_TABLET | Freq: Two times a day (BID) | ORAL | 2 refills | Status: DC

## 2018-03-14 NOTE — Progress Notes (Signed)
Endocrinology Consult Note       03/14/2018, 1:47 PM   Subjective:    Patient ID: Toni Olsen, female    DOB: 11-Jul-1966.  Toni Olsen is being seen in consultation for management of currently uncontrolled symptomatic diabetes requested by  Patient, No Pcp Per.   Past Medical History:  Diagnosis Date  . Atrial fibrillation ([place])    isolated - setting of decongestant & stimulant use  . Diabetes mellitus without complication ([place])    type 2  . Hypertension   . Obesity    Past Surgical History:  Procedure Laterality Date  . ABDOMINAL HYSTERECTOMY  1995  . CESAREAN SECTION  1990  . CHOLECYSTECTOMY  2002  . NM MYOCAR PERF WALL MOTION  04/2007   bruce myoview; normal pattern of perfusion to all regions; post-stress EF 70%; normal, low risk scan   . Sleep Study  06/2004   mild OSA/hypopnea syndrome  . TRANSTHORACIC ECHOCARDIOGRAM  04/2007   EF=>55%, mild asymmetric LVH; trace MR/TR   Social History   Socioeconomic History  . Marital status: Divorced    Spouse name: Not on file  . Number of children: 1  . Years of education: 90  . Highest education level: Not on file  Occupational History  . Occupation: Forensic psychologist: SELF-EMPLOYED  Social Needs  . Financial resource strain: Not on file  . Food insecurity:    Worry: Not on file    Inability: Not on file  . Transportation needs:    Medical: Not on file    Non-medical: Not on file  Tobacco Use  . Smoking status: Never Smoker  . Smokeless tobacco: Never Used  Substance and Sexual Activity  . Alcohol use: Yes    Alcohol/week: 0.0 standard drinks    Comment: occasional  . Drug use: No  . Sexual activity: Yes  Lifestyle  . Physical activity:    Days per week: Not on file    Minutes per session: Not on file  . Stress: Not on file  Relationships  . Social connections:    Talks on phone: Not on file    Gets together: Not on file     Attends religious service: Not on file    Active member of club or organization: Not on file    Attends meetings of clubs or organizations: Not on file    Relationship status: Not on file  Other Topics Concern  . Not on file  Social History Narrative  . Not on file   Outpatient Encounter Medications as of 03/14/2018  Medication Sig  . metoprolol succinate (TOPROL-XL) 25 MG 24 hr tablet Take 1 tablet (25 mg total) by mouth daily.  . Multiple Vitamin (MULTIVITAMIN) tablet Take 1 tablet by mouth daily.  . Blood Glucose Monitoring Suppl (BLOOD GLUCOSE SYSTEM PAK) KIT Please dispense based on patient and insurance preference. Use as directed to monitor FSBS 2x daily. Dx: E11.9 (Patient not taking: Reported on 03/14/2018)  . Glucose Blood (BLOOD GLUCOSE TEST STRIPS) STRP Please dispense based on patient and insurance preference. Use as directed to monitor FSBS 2x daily. Dx:  E11.9 (Patient not taking: Reported on 03/14/2018)  . Lancet Devices MISC Please dispense based on patient and insurance preference. Use as directed to monitor FSBS 2x daily. Dx: E11.9 (Patient not taking: Reported on 03/14/2018)  . Lancets MISC Please dispense based on patient and insurance preference. Use as directed to monitor FSBS 2x daily. Dx: E11.9 (Patient not taking: Reported on 03/14/2018)  . meloxicam (MOBIC) 7.5 MG tablet Take 1 tablet (7.5 mg total) by mouth daily. (Patient not taking: Reported on 03/14/2018)  . metFORMIN (GLUCOPHAGE) 500 MG tablet Take 1 tablet (500 mg total) by mouth 2 (two) times daily with a meal.  . [DISCONTINUED] empagliflozin (JARDIANCE) 10 MG TABS tablet Take 10 mg by mouth daily. (Patient not taking: Reported on 03/14/2018)  . [DISCONTINUED] rosuvastatin (CRESTOR) 20 MG tablet Take 1 tablet (20 mg total) by mouth daily. (Patient not taking: Reported on 03/14/2018)   No facility-administered encounter medications on file as of 03/14/2018.     ALLERGIES: Allergies  Allergen Reactions  . Penicillins Rash   . Sulfa Antibiotics Rash    VACCINATION STATUS: Immunization History  Administered Date(s) Administered  . Influenza Whole 03/29/2007  . Influenza,inj,Quad PF,6+ Mos 06/04/2013, 05/12/2015  . Pneumococcal Polysaccharide-23 11/17/2005, 06/04/2013  . Td 10/04/2005    Diabetes  She presents for her initial diabetic visit. She has type 2 diabetes mellitus. Onset time: She was diagnosed at approximate age of 54 years. Her disease course has been worsening. There are no hypoglycemic associated symptoms. Pertinent negatives for hypoglycemia include no confusion, headaches, pallor or seizures. Associated symptoms include fatigue, polydipsia, polyuria and weight loss. Pertinent negatives for diabetes include no chest pain and no polyphagia. There are no hypoglycemic complications. Symptoms are worsening. There are no diabetic complications. Risk factors for coronary artery disease include diabetes mellitus, dyslipidemia, obesity, hypertension and sedentary lifestyle. When asked about current treatments, none were reported. Her weight is decreasing steadily. She is following a generally unhealthy diet. When asked about meal planning, she reported none. She has not had a previous visit with a dietitian. She never participates in exercise. (She did not bring any meter nor logs to review today.  Her most recent A1c was 11%.) An ACE inhibitor/angiotensin II receptor blocker is not being taken.  Hyperlipidemia  This is a chronic problem. The current episode started more than 1 year ago. The problem is uncontrolled. Exacerbating diseases include diabetes and obesity. Pertinent negatives include no chest pain, myalgias or shortness of breath. She is currently on no antihyperlipidemic treatment. Risk factors for coronary artery disease include diabetes mellitus, dyslipidemia, hypertension, a sedentary lifestyle, post-menopausal and obesity.  Hypertension  This is a chronic problem. The current episode started more  than 1 year ago. The problem is uncontrolled. Pertinent negatives include no chest pain, headaches, palpitations or shortness of breath. Risk factors for coronary artery disease include diabetes mellitus, dyslipidemia, obesity, sedentary lifestyle, smoking/tobacco exposure and post-menopausal state. Past treatments include beta blockers.      Review of Systems  Constitutional: Positive for fatigue and weight loss. Negative for chills, fever and unexpected weight change.  HENT: Negative for trouble swallowing and voice change.   Eyes: Negative for visual disturbance.  Respiratory: Negative for cough, shortness of breath and wheezing.   Cardiovascular: Negative for chest pain, palpitations and leg swelling.  Gastrointestinal: Negative for diarrhea, nausea and vomiting.  Endocrine: Positive for polydipsia and polyuria. Negative for cold intolerance, heat intolerance and polyphagia.  Musculoskeletal: Negative for arthralgias and myalgias.  Skin: Negative for  color change, pallor, rash and wound.  Neurological: Negative for seizures and headaches.  Psychiatric/Behavioral: Negative for confusion and suicidal ideas.    Objective:    BP (!) 142/86   Pulse 76   Ht '5\' 4"'  (1.626 m)   Wt 251 lb (113.9 kg)   BMI 43.08 kg/m   Wt Readings from Last 3 Encounters:  03/14/18 251 lb (113.9 kg)  01/19/18 256 lb (116.1 kg)  06/16/17 254 lb (115.2 kg)     Physical Exam  Constitutional: She is oriented to person, place, and time. She appears well-developed.  HENT:  Head: Normocephalic and atraumatic.  Eyes: EOM are normal.  Neck: Normal range of motion. Neck supple. No tracheal deviation present. No thyromegaly present.  Cardiovascular: Normal rate and regular rhythm.  Pulmonary/Chest: Effort normal and breath sounds normal.  Abdominal: Soft. Bowel sounds are normal. There is no tenderness. There is no guarding.  Musculoskeletal: Normal range of motion. She exhibits no edema.  Neurological: She  is alert and oriented to person, place, and time. She has normal reflexes. No cranial nerve deficit. Coordination normal.  Skin: Skin is warm and dry. No rash noted. No erythema. No pallor.  Psychiatric: She has a normal mood and affect. Judgment normal.  She is hesitant.      CMP ( most recent) CMP     Component Value Date/Time   NA 134 (L) 01/25/2018 1552   K 4.1 01/25/2018 1552   CL 99 01/25/2018 1552   CO2 26 01/25/2018 1552   GLUCOSE 298 (H) 01/25/2018 1552   GLUCOSE 217 (H) 02/27/2016 1257   BUN 15 01/25/2018 1552   CREATININE 0.80 01/25/2018 1552   CREATININE 0.84 02/27/2016 1257   CALCIUM 10.3 01/25/2018 1552   PROT 7.6 02/27/2016 1257   ALBUMIN 4.1 02/27/2016 1257   AST 18 02/27/2016 1257   ALT 24 02/27/2016 1257   ALKPHOS 95 02/27/2016 1257   BILITOT 0.3 02/27/2016 1257   GFRNONAA >60 01/25/2018 1552   GFRAA >60 01/25/2018 1552     Diabetic Labs (most recent): Lab Results  Component Value Date   HGBA1C 11.0 (H) 01/25/2018   HGBA1C 9.8 (H) 02/27/2016   HGBA1C 8.0 (H) 05/12/2015     Lipid Panel ( most recent) Lipid Panel     Component Value Date/Time   CHOL 226 (H) 01/25/2018 1552   TRIG 314 (H) 01/25/2018 1552   HDL 49 01/25/2018 1552   CHOLHDL 4.6 01/25/2018 1552   VLDL 63 (H) 01/25/2018 1552   LDLCALC 114 (H) 01/25/2018 1552      Lab Results  Component Value Date   TSH 2.384 01/25/2018   TSH 3.18 02/27/2016   TSH 2.867 05/12/2015   TSH 3.808 12/05/2009   TSH 4.039 09/24/2008           Assessment & Plan:   1. Uncontrolled type 2 diabetes mellitus with hyperglycemia ([place])  - ICEL CASTLES has currently uncontrolled symptomatic type 2 DM since 52 years of age,  with most recent A1c of 11 %. Recent labs reviewed.  -her diabetes is complicated by obesity/sedentary life and JAYDI BRAY remains at a high risk for more acute and chronic complications which include CAD, CVA, CKD, retinopathy, and neuropathy. These are all discussed in  detail with the patient.  - I have counseled her on diet management and weight loss, by adopting a carbohydrate restricted/protein rich diet.  - Suggestion is made for her to avoid simple carbohydrates  from her diet including  Cakes, Sweet Desserts, Ice Cream, Soda (diet and regular), Sweet Tea, Candies, Chips, Cookies, Store Bought Juices, Alcohol in Excess of  1-2 drinks a day, Artificial Sweeteners, and "Sugar-free" Products. This will help patient to have stable blood glucose profile and potentially avoid unintended weight gain.  - I encouraged her to switch to  unprocessed or minimally processed complex starch and increased protein intake (animal or plant source), fruits, and vegetables.  - she is advised to stick to a routine mealtimes to eat 3 meals  a day and avoid unnecessary snacks ( to snack only to correct hypoglycemia).   - she will be scheduled with Jearld Fenton, RDN, CDE for individualized diabetes education.  - I have approached her with the following individualized plan to manage diabetes and patient agrees:   -Given her current and prevailing glycemic burden, she will likely require insulin treatment in order for her to achieve and maintain control of diabetes to target.   -However, she is very hesitant today to start insulin treatment.   -She is willing to initiate strict monitoring of blood glucose.   -I approached her to start monitoring blood glucose 4 times a day- before meals and at bedtime and return in 10 days with her meter and logs for reevaluation.   -Patient is encouraged to call clinic for blood glucose levels less than 70 or above 300 mg /dl. In the meantime, she would like to retry metformin treatment.  I discussed and prescribed low-dose metformin 500 mg p.o. twice daily after meals, therapeutically suitable for patient . - she will be considered for incretin therapy as appropriate next visit. - Patient specific target  A1c;  LDL, HDL, Triglycerides, and   Waist Circumference were discussed in detail.  2) BP/HTN: Her blood pressure is not controlled to target.  She claims to have whitecoat syndrome.  She is advised to continue her Toprol-XL 25 mg p.o. daily with breakfast.    3) Lipids/HPL:   Her recent lipid panel showed uncontrolled LDL at 118.  He reports significant intolerance to statins.  She also has hypertriglyceridemia of 318.  She will be considered for fenofibrate next visit.   4)  Weight/Diet: CDE Consult will be initiated , exercise, and detailed carbohydrates information provided.  5) Chronic Care/Health Maintenance:  -she  Is not  on ACEI/ARB and Statin medications and  is encouraged to initiate and continue to follow up with Ophthalmology, Dentist,  Podiatrist at least yearly or according to recommendations, and advised to  stay away from smoking. I have recommended yearly flu vaccine and pneumonia vaccine at least every 5 years; moderate intensity exercise for up to 150 minutes weekly; and  sleep for at least 7 hours a day.  - I advised patient to maintain close follow up with Patient, No Pcp Per for primary care needs.  - Time spent with the patient: 45 minutes, of which >50% was spent in obtaining information about her symptoms, reviewing her previous labs, evaluations, and treatments, counseling her about her currently uncontrolled type 2 diabetes, hyperlipidemia, hypertension, and developing developing  plans for long term treatment based on the latest recommendations.  Edilia Bo participated in the discussions, expressed understanding, and voiced agreement with the above plans.  All questions were answered to her satisfaction. she is encouraged to contact clinic should she have any questions or concerns prior to her return visit.  Follow up plan: - Return in about 10 days (around 03/24/2018) for Follow up with Meter and Logs Only -  no Labs.  Glade Lloyd, MD [facility] [address] [place], [place] [address] Phone: [phone]  Fax: [phone]    03/14/2018, 1:47 PM  This note was partially dictated with voice recognition software. Similar sounding words can be transcribed inadequately or may not  be corrected upon review.

## 2018-03-14 NOTE — Patient Instructions (Signed)

## 2018-03-30 ENCOUNTER — Ambulatory Visit (INDEPENDENT_AMBULATORY_CARE_PROVIDER_SITE_OTHER): Payer: PRIVATE HEALTH INSURANCE | Admitting: "Endocrinology

## 2018-03-30 ENCOUNTER — Encounter: Payer: Self-pay | Admitting: "Endocrinology

## 2018-03-30 VITALS — BP 134/85 | HR 76 | Ht 64.0 in | Wt 248.0 lb

## 2018-03-30 DIAGNOSIS — I10 Essential (primary) hypertension: Secondary | ICD-10-CM

## 2018-03-30 DIAGNOSIS — E782 Mixed hyperlipidemia: Secondary | ICD-10-CM | POA: Diagnosis not present

## 2018-03-30 DIAGNOSIS — E1165 Type 2 diabetes mellitus with hyperglycemia: Secondary | ICD-10-CM | POA: Diagnosis not present

## 2018-03-30 MED ORDER — FENOFIBRATE 54 MG PO TABS: 54.0000 mg | ORAL_TABLET | Freq: Every day | ORAL | 6 refills | Status: DC

## 2018-03-30 NOTE — Progress Notes (Signed)
Endocrinology Consult Note       03/30/2018, 9:05 AM   Subjective:    Patient ID: Toni Olsen, female    DOB: 12-03-1965.  Toni Olsen is being seen in consultation for management of currently uncontrolled symptomatic diabetes requested by  Patient, No Pcp Per.   Past Medical History:  Diagnosis Date  . Atrial fibrillation ([place])    isolated - setting of decongestant & stimulant use  . Diabetes mellitus without complication ([place])    type 2  . Hypertension   . Obesity    Past Surgical History:  Procedure Laterality Date  . ABDOMINAL HYSTERECTOMY  1995  . CESAREAN SECTION  1990  . CHOLECYSTECTOMY  2002  . NM MYOCAR PERF WALL MOTION  04/2007   bruce myoview; normal pattern of perfusion to all regions; post-stress EF 70%; normal, low risk scan   . Sleep Study  06/2004   mild OSA/hypopnea syndrome  . TRANSTHORACIC ECHOCARDIOGRAM  04/2007   EF=>55%, mild asymmetric LVH; trace MR/TR   Social History   Socioeconomic History  . Marital status: Divorced    Spouse name: Not on file  . Number of children: 1  . Years of education: 61  . Highest education level: Not on file  Occupational History  . Occupation: Forensic psychologist: SELF-EMPLOYED  Social Needs  . Financial resource strain: Not on file  . Food insecurity:    Worry: Not on file    Inability: Not on file  . Transportation needs:    Medical: Not on file    Non-medical: Not on file  Tobacco Use  . Smoking status: Never Smoker  . Smokeless tobacco: Never Used  Substance and Sexual Activity  . Alcohol use: Yes    Alcohol/week: 0.0 standard drinks    Comment: occasional  . Drug use: No  . Sexual activity: Yes  Lifestyle  . Physical activity:    Days per week: Not on file    Minutes per session: Not on file  . Stress: Not on file  Relationships  . Social connections:    Talks on phone: Not on file    Gets together: Not on  file    Attends religious service: Not on file    Active member of club or organization: Not on file    Attends meetings of clubs or organizations: Not on file    Relationship status: Not on file  Other Topics Concern  . Not on file  Social History Narrative  . Not on file   Outpatient Encounter Medications as of 03/30/2018  Medication Sig  . Nutritional Supplements (JUICE PLUS FIBRE PO) Take by mouth daily.  . Blood Glucose Monitoring Suppl (BLOOD GLUCOSE SYSTEM PAK) KIT Please dispense based on patient and insurance preference. Use as directed to monitor FSBS 2x daily. Dx: E11.9 (Patient not taking: Reported on 03/14/2018)  . fenofibrate 54 MG tablet Take 1 tablet (54 mg total) by mouth at bedtime.  . Glucose Blood (BLOOD GLUCOSE TEST STRIPS) STRP Please dispense based on patient and insurance preference. Use as directed to monitor FSBS 2x daily. Dx: E11.9 (Patient not  taking: Reported on 03/14/2018)  . Lancet Devices MISC Please dispense based on patient and insurance preference. Use as directed to monitor FSBS 2x daily. Dx: E11.9 (Patient not taking: Reported on 03/14/2018)  . Lancets MISC Please dispense based on patient and insurance preference. Use as directed to monitor FSBS 2x daily. Dx: E11.9 (Patient not taking: Reported on 03/14/2018)  . meloxicam (MOBIC) 7.5 MG tablet Take 1 tablet (7.5 mg total) by mouth daily. (Patient not taking: Reported on 03/14/2018)  . metFORMIN (GLUCOPHAGE) 500 MG tablet Take 1 tablet (500 mg total) by mouth 2 (two) times daily with a meal.  . metoprolol succinate (TOPROL-XL) 25 MG 24 hr tablet Take 1 tablet (25 mg total) by mouth daily.  . Multiple Vitamin (MULTIVITAMIN) tablet Take 1 tablet by mouth daily.   No facility-administered encounter medications on file as of 03/30/2018.     ALLERGIES: Allergies  Allergen Reactions  . Penicillins Rash  . Sulfa Antibiotics Rash    VACCINATION STATUS: Immunization History  Administered Date(s) Administered  .  Influenza Whole 03/29/2007  . Influenza,inj,Quad PF,6+ Mos 06/04/2013, 05/12/2015  . Pneumococcal Polysaccharide-23 11/17/2005, 06/04/2013  . Td 10/04/2005    Diabetes  She presents for her follow-up diabetic visit. She has type 2 diabetes mellitus. Onset time: She was diagnosed at approximate age of 48 years. Her disease course has been improving. There are no hypoglycemic associated symptoms. Pertinent negatives for hypoglycemia include no confusion, headaches, pallor or seizures. Associated symptoms include fatigue, polydipsia, polyuria and weight loss. Pertinent negatives for diabetes include no chest pain and no polyphagia. There are no hypoglycemic complications. Symptoms are improving. There are no diabetic complications. Risk factors for coronary artery disease include diabetes mellitus, dyslipidemia, obesity, hypertension and sedentary lifestyle. When asked about current treatments, none were reported. Her weight is decreasing steadily. When asked about meal planning, she reported none. She has not had a previous visit with a dietitian. She never participates in exercise. Her breakfast blood glucose range is generally 140-180 mg/dl. Her lunch blood glucose range is generally 140-180 mg/dl. Her dinner blood glucose range is generally 140-180 mg/dl. Her bedtime blood glucose range is generally 140-180 mg/dl. Her overall blood glucose range is 140-180 mg/dl. (She with significant improvement in her glycemic profile, tolerating metformin.  Prior to her last visit A1c was 11%.    ) An ACE inhibitor/angiotensin II receptor blocker is not being taken.  Hyperlipidemia  This is a chronic problem. The current episode started more than 1 year ago. The problem is uncontrolled. Exacerbating diseases include diabetes and obesity. Pertinent negatives include no chest pain, myalgias or shortness of breath. She is currently on no antihyperlipidemic treatment. Risk factors for coronary artery disease include  diabetes mellitus, dyslipidemia, hypertension, a sedentary lifestyle, post-menopausal and obesity.  Hypertension  This is a chronic problem. The current episode started more than 1 year ago. The problem is uncontrolled. Pertinent negatives include no chest pain, headaches, palpitations or shortness of breath. Risk factors for coronary artery disease include diabetes mellitus, dyslipidemia, obesity, sedentary lifestyle, smoking/tobacco exposure and post-menopausal state. Past treatments include beta blockers.      Review of Systems  Constitutional: Positive for fatigue and weight loss. Negative for chills, fever and unexpected weight change.  HENT: Negative for trouble swallowing and voice change.   Eyes: Negative for visual disturbance.  Respiratory: Negative for cough, shortness of breath and wheezing.   Cardiovascular: Negative for chest pain, palpitations and leg swelling.  Gastrointestinal: Negative for diarrhea, nausea and vomiting.  Endocrine: Positive for polydipsia and polyuria. Negative for cold intolerance, heat intolerance and polyphagia.  Musculoskeletal: Negative for arthralgias and myalgias.  Skin: Negative for color change, pallor, rash and wound.  Neurological: Negative for seizures and headaches.  Psychiatric/Behavioral: Negative for confusion and suicidal ideas.    Objective:    BP 134/85   Pulse 76   Ht '5\' 4"'  (1.626 m)   Wt 248 lb (112.5 kg)   BMI 42.57 kg/m   Wt Readings from Last 3 Encounters:  03/30/18 248 lb (112.5 kg)  03/14/18 251 lb (113.9 kg)  01/19/18 256 lb (116.1 kg)     Physical Exam  Constitutional: She is oriented to person, place, and time. She appears well-developed.  HENT:  Head: Normocephalic and atraumatic.  Eyes: EOM are normal.  Neck: Normal range of motion. Neck supple. No tracheal deviation present. No thyromegaly present.  Cardiovascular: Normal rate and regular rhythm.  Pulmonary/Chest: Effort normal and breath sounds normal.   Abdominal: Soft. Bowel sounds are normal. There is no tenderness. There is no guarding.  Musculoskeletal: Normal range of motion. She exhibits no edema.  Neurological: She is alert and oriented to person, place, and time. She has normal reflexes. No cranial nerve deficit. Coordination normal.  Skin: Skin is warm and dry. No rash noted. No erythema. No pallor.  Psychiatric: She has a normal mood and affect. Judgment normal.  She is hesitant.      CMP ( most recent) CMP     Component Value Date/Time   NA 134 (L) 01/25/2018 1552   K 4.1 01/25/2018 1552   CL 99 01/25/2018 1552   CO2 26 01/25/2018 1552   GLUCOSE 298 (H) 01/25/2018 1552   GLUCOSE 217 (H) 02/27/2016 1257   BUN 15 01/25/2018 1552   CREATININE 0.80 01/25/2018 1552   CREATININE 0.84 02/27/2016 1257   CALCIUM 10.3 01/25/2018 1552   PROT 7.6 02/27/2016 1257   ALBUMIN 4.1 02/27/2016 1257   AST 18 02/27/2016 1257   ALT 24 02/27/2016 1257   ALKPHOS 95 02/27/2016 1257   BILITOT 0.3 02/27/2016 1257   GFRNONAA >60 01/25/2018 1552   GFRAA >60 01/25/2018 1552     Diabetic Labs (most recent): Lab Results  Component Value Date   HGBA1C 11.0 (H) 01/25/2018   HGBA1C 9.8 (H) 02/27/2016   HGBA1C 8.0 (H) 05/12/2015     Lipid Panel ( most recent) Lipid Panel     Component Value Date/Time   CHOL 226 (H) 01/25/2018 1552   TRIG 314 (H) 01/25/2018 1552   HDL 49 01/25/2018 1552   CHOLHDL 4.6 01/25/2018 1552   VLDL 63 (H) 01/25/2018 1552   LDLCALC 114 (H) 01/25/2018 1552      Lab Results  Component Value Date   TSH 2.384 01/25/2018   TSH 3.18 02/27/2016   TSH 2.867 05/12/2015   TSH 3.808 12/05/2009   TSH 4.039 09/24/2008           Assessment & Plan:   1. Uncontrolled type 2 diabetes mellitus with hyperglycemia ([place])  - Toni Olsen has currently uncontrolled symptomatic type 2 DM since 52 years of age. -Prior to her last visit A1c of 11%, patient declined insulin treatment.  She returns with significant  improvement in her glycemic profile on retrial of metformin. Recent labs reviewed.  -her diabetes is complicated by obesity/sedentary life and Toni Olsen remains at a high risk for more acute and chronic complications which include CAD, CVA, CKD, retinopathy, and neuropathy. These are  all discussed in detail with the patient.  - I have counseled her on diet management and weight loss, by adopting a carbohydrate restricted/protein rich diet.  -  Suggestion is made for her to avoid simple carbohydrates  from her diet including Cakes, Sweet Desserts / Pastries, Ice Cream, Soda (diet and regular), Sweet Tea, Candies, Chips, Cookies, Store Bought Juices, Alcohol in Excess of  1-2 drinks a day, Artificial Sweeteners, and "Sugar-free" Products. This will help patient to have stable blood glucose profile and potentially avoid unintended weight gain.   - I encouraged her to switch to  unprocessed or minimally processed complex starch and increased protein intake (animal or plant source), fruits, and vegetables.  - she is advised to stick to a routine mealtimes to eat 3 meals  a day and avoid unnecessary snacks ( to snack only to correct hypoglycemia).   - she has been  scheduled with Jearld Fenton, RDN, CDE for individualized diabetes education.  - I have approached her with the following individualized plan to manage diabetes and patient agrees:   -Given her presentation with improved glycemic profile averaging between 140-180, she will not require insulin treatment at this time.   -She made a big change in her lifestyle and promises to continue to do so.   -He has tolerated low-dose metformin retired.   -She is advised to continue metformin 500 mg p.o. twice daily after meals, therapeutically suitable for the patient.    - she will be considered for incretin therapy as appropriate next visit. - Patient specific target  A1c;  LDL, HDL, Triglycerides, and  Waist Circumference were discussed in  detail.  2) BP/HTN: Her blood pressure is not controlled to target.  She claims to have whitecoat syndrome.  She is advised to continue her Toprol-XL 25 mg p.o. daily with breakfast.    3) Lipids/HPL:   Her recent lipid panel showed uncontrolled LDL at 118.  She reports significant intolerance to statins.  She also has hypertriglyceridemia of 318.  She is offered prescription for fenofibrate 54 mg p.o. nightly, side effects and precautions discussed with her.     4)  Weight/Diet: CDE Consult will be initiated , exercise, and detailed carbohydrates information provided.  5) Chronic Care/Health Maintenance:  -she  Is not  on ACEI/ARB and Statin medications and  is encouraged to initiate and continue to follow up with Ophthalmology, Dentist,  Podiatrist at least yearly or according to recommendations, and advised to  stay away from smoking. I have recommended yearly flu vaccine and pneumonia vaccine at least every 5 years; moderate intensity exercise for up to 150 minutes weekly; and  sleep for at least 7 hours a day.  - I advised patient to maintain close follow up with Patient, No Pcp Per for primary care needs.  - Time spent with the patient: 25 min, of which >50% was spent in reviewing her blood glucose logs , discussing her hypo- and hyper-glycemic episodes, reviewing her current and  previous labs and insulin doses and developing a plan to avoid hypo- and hyper-glycemia. Please refer to Patient Instructions for Blood Glucose Monitoring and Insulin/Medications Dosing Guide"  in media tab for additional information. Edilia Bo participated in the discussions, expressed understanding, and voiced agreement with the above plans.  All questions were answered to her satisfaction. she is encouraged to contact clinic should she have any questions or concerns prior to her return visit.   Follow up plan: - Return in about 5 weeks (around  05/04/2018) for Follow up with Pre-visit Labs, Meter, and  Logs.  Glade Lloyd, MD [facility] [address] [place], [place] [address] Phone: [phone]  Fax: [phone]    03/30/2018, 9:05 AM  This note was partially dictated with voice recognition software. Similar sounding words can be transcribed inadequately or may not  be corrected upon review.

## 2018-03-30 NOTE — Patient Instructions (Signed)

## 2018-04-25 ENCOUNTER — Encounter: Payer: No Typology Code available for payment source | Attending: "Endocrinology | Admitting: Nutrition

## 2018-04-25 VITALS — Ht 64.0 in | Wt 252.0 lb

## 2018-04-25 DIAGNOSIS — Z713 Dietary counseling and surveillance: Secondary | ICD-10-CM | POA: Insufficient documentation

## 2018-04-25 DIAGNOSIS — E669 Obesity, unspecified: Secondary | ICD-10-CM | POA: Diagnosis not present

## 2018-04-25 DIAGNOSIS — E785 Hyperlipidemia, unspecified: Secondary | ICD-10-CM | POA: Insufficient documentation

## 2018-04-25 DIAGNOSIS — IMO0002 Reserved for concepts with insufficient information to code with codable children: Secondary | ICD-10-CM

## 2018-04-25 DIAGNOSIS — E119 Type 2 diabetes mellitus without complications: Secondary | ICD-10-CM | POA: Diagnosis not present

## 2018-04-25 DIAGNOSIS — Z6841 Body Mass Index (BMI) 40.0 and over, adult: Secondary | ICD-10-CM | POA: Insufficient documentation

## 2018-04-25 DIAGNOSIS — E1165 Type 2 diabetes mellitus with hyperglycemia: Secondary | ICD-10-CM

## 2018-04-25 DIAGNOSIS — E118 Type 2 diabetes mellitus with unspecified complications: Secondary | ICD-10-CM

## 2018-04-25 NOTE — Patient Instructions (Signed)
Goals 1. Follow MY Plate 2. Eat 2-3 carb choices per meal 3. No snacks 4. No skipping meals 5 Drink only water  6. Exercise 30 minutes a day Get A1C to 7% or less Lose 3-4 lb per month

## 2018-04-25 NOTE — Progress Notes (Signed)
  Medical Nutrition Therapy:  Appt start time: 1530 end time:  1630.   Assessment:  Primary concerns today: DIabetes Type 2, Hyperlipidemia, HTN, Obesity..  Lives by herself. 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 Eats 3 meals per day. Cooking more meals at home now. Started seeing Dr. Fransico Him, Endocrinology 2 months ago . Metformin 500 mg BID,  FBS 140-160's  Before dinner 120-130's/ Willing to make further changes and start improve her weight and her BS. Lab Results  Component Value Date   HGBA1C 11.0 (H) 01/25/2018   CMP Latest Ref Rng & Units 01/25/2018 02/27/2016 02/27/2016  Glucose 70 - 99 mg/dL 960(A) 540(J) 811(B)  BUN 6 - 20 mg/dL 15 13 -  Creatinine 1.47 - 1.00 mg/dL 8.29 5.62 -  Sodium 130 - 145 mmol/L 134(L) 138 -  Potassium 3.5 - 5.1 mmol/L 4.1 4.3 -  Chloride 98 - 111 mmol/L 99 100 -  CO2 22 - 32 mmol/L 26 24 -  Calcium 8.9 - 10.3 mg/dL 86.5 78.4 -  Total Protein 6.1 - 8.1 g/dL - 7.6 -  Total Bilirubin 0.2 - 1.2 mg/dL - 0.3 -  Alkaline Phos 33 - 130 U/L - 95 -  AST 10 - 35 U/L - 18 -  ALT 6 - 29 U/L - 24 -   Lipid Panel     Component Value Date/Time   CHOL 226 (H) 01/25/2018 1552   TRIG 314 (H) 01/25/2018 1552   HDL 49 01/25/2018 1552   CHOLHDL 4.6 01/25/2018 1552   VLDL 63 (H) 01/25/2018 1552   LDLCALC 114 (H) 01/25/2018 1552      Preferred Learning Style:   No preference indicated   Learning Readiness:  Ready  Change in progress   MEDICATIONS:    DIETARY INTAKE:  24-hr recall:  B ( AM): Oatmeal or cherrios or boiled eggs, 2 slices bacoin,  water  Snk ( AM): fruit L ( PM): chicken salad with dressing, 1/2 1/2 tea, Snk ( PM): fruit or adkins- bar D ( PM): 1/2 salad, 1/2 steak and apple Snk ( PM):  Beverages: water  Usual physical activity: ADL and going to [facility], walking  Estimated energy needs: 1500  calories 170 g carbohydrates 112 g protein 42 g fat  Progress Towards Goal(s):  In progress.   Nutritional Diagnosis: Type 2  Dm, Hyperlidemia and Obesity due to knowledge deficent, excess calorie intake and inactivity.    Intervention:  Nutrition Nutrition and Diabetes education provided on My Plate, CHO counting, meal planning, portion sizes, timing of meals, avoiding snacks between meals unless having a low blood sugar, target ranges for A1C and blood sugars, signs/symptoms and treatment of hyper/hypoglycemia, monitoring blood sugars, taking medications as prescribed, benefits of exercising 30 minutes per day and prevention of complications of DM. Marland KitchenGoals 1. Follow MY Plate 2. Eat 2-3 carb choices per meal 3. No snacks 4. No skipping meals 5 Drink only water  6. Exercise 30 minutes a day Get A1C to 7% or less Lose 3-4 lb per month   Teaching Method Utilized:  Visual Auditory Hands on  Handouts given during visit include:  The Plate Method   Meal Plan Card  Diabetes Instructions   Barriers to learning/adherence to lifestyle change: none  Demonstrated degree of understanding via:  Teach Back   Monitoring/Evaluation:  Dietary intake, exercise, meal planing, and body weight in 1 month(s).

## 2018-05-01 ENCOUNTER — Ambulatory Visit: Payer: No Typology Code available for payment source | Admitting: Cardiovascular Disease

## 2018-05-04 ENCOUNTER — Encounter: Payer: Self-pay | Admitting: Nutrition

## 2018-05-16 ENCOUNTER — Ambulatory Visit: Payer: PRIVATE HEALTH INSURANCE | Admitting: "Endocrinology

## 2018-05-16 ENCOUNTER — Ambulatory Visit: Payer: No Typology Code available for payment source | Admitting: Nutrition

## 2018-05-31 ENCOUNTER — Telehealth: Payer: Self-pay | Admitting: "Endocrinology

## 2018-05-31 MED ORDER — METFORMIN HCL 500 MG PO TABS: 500.0000 mg | ORAL_TABLET | Freq: Two times a day (BID) | ORAL | 2 refills | Status: DC

## 2018-05-31 NOTE — Telephone Encounter (Signed)
rx sent

## 2018-05-31 NOTE — Telephone Encounter (Signed)
Toni Olsen is asking for a refill on metFORMIN (GLUCOPHAGE) [facility]

## 2018-06-21 ENCOUNTER — Ambulatory Visit: Payer: No Typology Code available for payment source | Admitting: Nutrition

## 2018-06-21 ENCOUNTER — Ambulatory Visit: Payer: PRIVATE HEALTH INSURANCE | Admitting: "Endocrinology

## 2018-07-24 ENCOUNTER — Ambulatory Visit: Payer: No Typology Code available for payment source | Admitting: Cardiovascular Disease

## 2018-07-25 ENCOUNTER — Encounter: Payer: Self-pay | Admitting: Cardiovascular Disease

## 2018-07-26 ENCOUNTER — Ambulatory Visit: Payer: No Typology Code available for payment source | Admitting: Nutrition

## 2018-10-02 ENCOUNTER — Other Ambulatory Visit: Payer: Self-pay

## 2018-10-02 MED ORDER — FENOFIBRATE 54 MG PO TABS: 54.0000 mg | ORAL_TABLET | Freq: Every day | ORAL | 1 refills | Status: DC

## 2018-10-04 ENCOUNTER — Other Ambulatory Visit: Payer: Self-pay

## 2018-10-04 MED ORDER — FENOFIBRATE 54 MG PO TABS: 54.0000 mg | ORAL_TABLET | Freq: Every day | ORAL | 0 refills | Status: DC

## 2019-02-16 ENCOUNTER — Other Ambulatory Visit: Payer: Self-pay | Admitting: Adult Health

## 2019-02-20 ENCOUNTER — Other Ambulatory Visit: Payer: Self-pay | Admitting: "Endocrinology

## 2019-03-15 ENCOUNTER — Other Ambulatory Visit: Payer: Self-pay

## 2019-03-15 ENCOUNTER — Encounter: Payer: Self-pay | Admitting: Cardiovascular Disease

## 2019-03-15 ENCOUNTER — Ambulatory Visit (INDEPENDENT_AMBULATORY_CARE_PROVIDER_SITE_OTHER): Payer: No Typology Code available for payment source | Admitting: Cardiovascular Disease

## 2019-03-15 VITALS — BP 133/84 | HR 77 | Temp 98.8°F | Ht 65.0 in | Wt 255.0 lb

## 2019-03-15 DIAGNOSIS — Z01818 Encounter for other preprocedural examination: Secondary | ICD-10-CM

## 2019-03-15 DIAGNOSIS — E119 Type 2 diabetes mellitus without complications: Secondary | ICD-10-CM

## 2019-03-15 DIAGNOSIS — I10 Essential (primary) hypertension: Secondary | ICD-10-CM | POA: Diagnosis not present

## 2019-03-15 DIAGNOSIS — I480 Paroxysmal atrial fibrillation: Secondary | ICD-10-CM | POA: Diagnosis not present

## 2019-03-15 MED ORDER — METOPROLOL SUCCINATE ER 25 MG PO TB24: 25.0000 mg | ORAL_TABLET | Freq: Every day | ORAL | 3 refills | Status: DC

## 2019-03-15 NOTE — Patient Instructions (Signed)
Medication Instructions: Your physician recommends that you continue on your current medications as directed. Please refer to the Current Medication list given to you today.   Labwork: None  Procedures/Testing: NONE Follow-Up: As needed with Dr.Koneswaran  Any Additional Special Instructions Will Be Listed Below (If Applicable).      Thank you for choosing [facility] !

## 2019-03-15 NOTE — Progress Notes (Signed)
SUBJECTIVE: The patient presents for routine follow-up and has a history of paroxysmal atrial fibrillation with no documented recurrences since January 2014. This was an isolated episode in the setting of decongestant and stimulant use.  I reviewed the echocardiogram performed on 06/14/2017 which demonstrated vigorous left ventricular systolic function, LVEF 65 to 70%, mild LVH, and normal regional wall motion.  She does not have a PCP.  She follows with endocrinology.  She has been doing well and denies chest pain, shortness of breath, leg swelling, and palpitations.  She needs to have a tooth extracted by dentist in [place].  She told me about her son who is going through divorce.  She also told me about how she used to walk 6 miles a day and attend [employer] classes 4 times per week.  She wants to begin walking at least a mile a day starting tomorrow.     Review of Systems: As per "subjective", otherwise negative.  Allergies  Allergen Reactions  . Penicillins Rash  . Sulfa Antibiotics Rash    Current Outpatient Medications  Medication Sig Dispense Refill  . Blood Glucose Monitoring Suppl (BLOOD GLUCOSE SYSTEM PAK) KIT Please dispense based on patient and insurance preference. Use as directed to monitor FSBS 2x daily. Dx: E11.9 1 each 1  . fenofibrate 54 MG tablet Take 1 tablet (54 mg total) by mouth at bedtime. 30 tablet 0  . Glucose Blood (BLOOD GLUCOSE TEST STRIPS) STRP Please dispense based on patient and insurance preference. Use as directed to monitor FSBS 2x daily. Dx: E11.9 100 each 1  . Lancet Devices MISC Please dispense based on patient and insurance preference. Use as directed to monitor FSBS 2x daily. Dx: E11.9 1 each 1  . Lancets MISC Please dispense based on patient and insurance preference. Use as directed to monitor FSBS 2x daily. Dx: E11.9 100 each 0  . meloxicam (MOBIC) 7.5 MG tablet Take 1 tablet (7.5 mg total) by mouth daily. 30 tablet 5  .  metFORMIN (GLUCOPHAGE) 500 MG tablet TAKE 1 TABLET BY MOUTH TWICE DAILY WITH MEALS 60 tablet 2  . metoprolol succinate (TOPROL-XL) 25 MG 24 hr tablet Take 1 tablet by mouth once daily 90 tablet 0  . Multiple Vitamin (MULTIVITAMIN) tablet Take 1 tablet by mouth daily.    . Nutritional Supplements (JUICE PLUS FIBRE PO) Take by mouth daily.     No current facility-administered medications for this visit.     Past Medical History:  Diagnosis Date  . Atrial fibrillation ([place])    isolated - setting of decongestant & stimulant use  . Diabetes mellitus without complication ([place])    type 2  . Hypertension   . Obesity     Past Surgical History:  Procedure Laterality Date  . ABDOMINAL HYSTERECTOMY  1995  . CESAREAN SECTION  1990  . CHOLECYSTECTOMY  2002  . NM MYOCAR PERF WALL MOTION  04/2007   bruce myoview; normal pattern of perfusion to all regions; post-stress EF 70%; normal, low risk scan   . Sleep Study  06/2004   mild OSA/hypopnea syndrome  . TRANSTHORACIC ECHOCARDIOGRAM  04/2007   EF=>55%, mild asymmetric LVH; trace MR/TR    Social History   Socioeconomic History  . Marital status: Divorced    Spouse name: Not on file  . Number of children: 1  . Years of education: 77  . Highest education level: Not on file  Occupational History  . Occupation: Forensic psychologist: SELF-EMPLOYED  Social Needs  . Financial resource strain: Not on file  . Food insecurity    Worry: Not on file    Inability: Not on file  . Transportation needs    Medical: Not on file    Non-medical: Not on file  Tobacco Use  . Smoking status: Never Smoker  . Smokeless tobacco: Never Used  Substance and Sexual Activity  . Alcohol use: Yes    Alcohol/week: 0.0 standard drinks    Comment: occasional  . Drug use: No  . Sexual activity: Yes  Lifestyle  . Physical activity    Days per week: Not on file    Minutes per session: Not on file  . Stress: Not on file  Relationships  . Social Product manager on phone: Not on file    Gets together: Not on file    Attends religious service: Not on file    Active member of club or organization: Not on file    Attends meetings of clubs or organizations: Not on file    Relationship status: Not on file  . Intimate partner violence    Fear of current or ex partner: Not on file    Emotionally abused: Not on file    Physically abused: Not on file    Forced sexual activity: Not on file  Other Topics Concern  . Not on file  Social History Narrative  . Not on file     Vitals:   03/15/19 1142  BP: 133/84  Pulse: 77  Temp: 98.8 F (37.1 C)  SpO2: 97%  Weight: 255 lb (115.7 kg)  Height: _0  (1.651 m)    Wt Readings from Last 3 Encounters:  03/15/19 255 lb (115.7 kg)  04/25/18 252 lb (114.3 kg)  03/30/18 248 lb (112.5 kg)     PHYSICAL EXAM General: NAD HEENT: Normal. Neck: No JVD, no thyromegaly. Lungs: Clear to auscultation bilaterally with normal respiratory effort. CV: Regular rate and rhythm, normal S1/S2, no S3/S4, no murmur. No pretibial or periankle edema.  No carotid bruit.   Abdomen: Soft, nontender, no distention.  Neurologic: Alert and oriented.  Psych: Normal affect. Skin: Normal. Musculoskeletal: No gross deformities.    ECG: Reviewed above under Subjective   Labs: Lab Results  Component Value Date/Time   K 4.1 01/25/2018 03:52 PM   BUN 15 01/25/2018 03:52 PM   CREATININE 0.80 01/25/2018 03:52 PM   CREATININE 0.84 02/27/2016 12:57 PM   ALT 24 02/27/2016 12:57 PM   TSH 2.384 01/25/2018 03:52 PM   TSH 3.18 02/27/2016 12:57 PM   HGB 13.9 01/25/2018 03:52 PM     Lipids: Lab Results  Component Value Date/Time   LDLCALC 114 (H) 01/25/2018 03:52 PM   CHOL 226 (H) 01/25/2018 03:52 PM   TRIG 314 (H) 01/25/2018 03:52 PM   HDL 49 01/25/2018 03:52 PM       ASSESSMENT AND PLAN: 1.  Paroxysmal atrial fibrillation: This was an isolated instance in the setting of decongestant and stimulant use.  If she  were to have recurrent documented atrial fibrillation, I would initiate systemic anticoagulation as she has a CHADSVASC score of 3.  Continue Toprol-XL 25 mg daily.  2. HTN: Blood pressure is normal.  No changes to therapy.  3.  Type 2 diabetes mellitus: She follows with endocrinology.  She warrants statin therapy.  She is on fenofibrate.  4.  Preoperative risk stratification: She is at low risk for planned procedure and needs no cardiac  testing.    Disposition: Follow up prn.  I encouraged her to establish with a PCP.   Kate Sable, M.D., F.A.C.C.

## 2019-09-21 ENCOUNTER — Other Ambulatory Visit (HOSPITAL_COMMUNITY): Payer: Self-pay | Admitting: Family Medicine

## 2019-09-21 DIAGNOSIS — Z1231 Encounter for screening mammogram for malignant neoplasm of breast: Secondary | ICD-10-CM

## 2019-10-01 ENCOUNTER — Other Ambulatory Visit: Payer: Self-pay

## 2019-10-01 ENCOUNTER — Ambulatory Visit (HOSPITAL_COMMUNITY)
Admission: RE | Admit: 2019-10-01 | Discharge: 2019-10-01 | Disposition: A | Discharge status: Home / Self Care | Payer: No Typology Code available for payment source | Source: Ambulatory Visit | Attending: Family Medicine | Admitting: Family Medicine

## 2019-10-01 DIAGNOSIS — Z1231 Encounter for screening mammogram for malignant neoplasm of breast: Secondary | ICD-10-CM | POA: Diagnosis not present

## 2020-11-04 ENCOUNTER — Other Ambulatory Visit (HOSPITAL_COMMUNITY): Payer: Self-pay | Admitting: *Deleted

## 2020-11-04 DIAGNOSIS — Z1231 Encounter for screening mammogram for malignant neoplasm of breast: Secondary | ICD-10-CM

## 2020-11-21 ENCOUNTER — Ambulatory Visit (HOSPITAL_COMMUNITY)
Admission: RE | Admit: 2020-11-21 | Discharge: 2020-11-21 | Disposition: A | Discharge status: Home / Self Care | Payer: Self-pay | Source: Ambulatory Visit | Attending: *Deleted | Admitting: *Deleted

## 2020-11-21 DIAGNOSIS — Z1231 Encounter for screening mammogram for malignant neoplasm of breast: Secondary | ICD-10-CM | POA: Insufficient documentation

## 2020-12-11 ENCOUNTER — Other Ambulatory Visit: Payer: Self-pay

## 2020-12-11 ENCOUNTER — Ambulatory Visit (INDEPENDENT_AMBULATORY_CARE_PROVIDER_SITE_OTHER): Payer: Self-pay | Admitting: *Deleted

## 2020-12-11 VITALS — Ht 63.0 in | Wt 229.4 lb

## 2020-12-11 DIAGNOSIS — Z1211 Encounter for screening for malignant neoplasm of colon: Secondary | ICD-10-CM

## 2020-12-11 NOTE — Patient Instructions (Signed)
[place]   Patient Name:  ARLYN BUMPUS Date of procedure: 01/26/2021 Time to register at [facility]:  9:00 AM Provider:  Dr. Abbey Chatters  Please notify us immediately if you are diabetic, take iron supplements, or if you are on coumadin or any blood thinners.  Please hold the following medications: See letter.  Note: Do NOT refrigerate or freeze CLENPIQ. CLENPIQ is ready to drink. There is no need to add any other liquid or mix the medicine in the bottle before you start dosing.   01/25/2021-  1 Day prior to procedure:     CLEAR LIQUIDS ALL DAY--NO SOLID FOODS OR DAIRY PRODUCTS! See list of liquids that are allowed and items that are NOT allowed below.   Diabetic Medication Instructions:  See letter.   You must drink plenty of CLEAR LIQUIDS starting before your bowel prep. It is important to stay adequately hydrated before, during, and after your bowel prep for the prep to work effectively!   At 4:00 PM Begin the prep as follows:    1. Drink one bottle of pre-mixed CLENPIQ right from the bottle.  2. Drink at least five (5) 8-ounce drinks of clear liquids of your choice within the next 5 hours   At 10:00 PM: 1. Drink the second bottle of pre-mixed CLENPIQ right from the bottle.   2. Drink at least three (3) 8-ounce drinks of clear liquids of your choice within the next 3 hours before going to bed.   Continue clear liquids.    01/26/2021-  Day of Procedure:   Diabetic medications adjustments: See letter.   You may take TYLENOL products.  Please continue your regular medications unless we have instructed you otherwise.     At 3 hours before procedure @ 7:30 am: Stop drinking all liquids, nothing by mouth at this point.   Please note, on the day of your procedure you MUST be accompanied by an adult who is willing to assume responsibility for you at time of discharge. If you do not have such person with you, your procedure will have to be rescheduled.                                                                                                                      Please leave ALL jewelry at home prior to coming to the hospital for your procedure.   *It is your responsibility to check with your insurance company for the benefits of coverage you have for this procedure. Unfortunately, not all insurance companies have benefits to cover all or part of these types of procedures. It is your responsibility to check your benefits, however we will be glad to assist you with any codes your insurance company may need.   Please note that most insurance companies will not cover a screening colonoscopy for people under the age of 80  For example, with some insurance companies you may have benefits for a screening colonoscopy, but if polyps are found the diagnosis  will change and then you may have a deductible that will need to be met. Please make sure you check your benefits for screening colonoscopy as well as a diagnostic colonoscopy.    CLEAR LIQUIDS: (NO RED or PURPLE) Water  Jello   Apple Juice  White Grape Juice   Kool-Aid Soft drinks  Banana popsicles Sports Drink  Black coffee (No cream or milk) Tea (No cream or milk)  Broth (fat free beef/chicken/vegetable)  Clear liquids allow you to see your fingers on the other side of the glass.  Be sure they are NOT RED or PURPLE in color, cloudy, but CLEAR.  Do Not Eat: Dairy products of any kind Cranberry juice Tomato or V8 Juice  Orange Juice   Grapefruit Juice Red Grape Juice Alcohol   Non-dairy creamer Solid foods like cereal, oatmeal, yogurt, fruits, vegetables, creamed soups, eggs, bread, etc   HELPFUL HINTS TO MAKE DRINKING EASIER: -Trying drinking through a straw. -If you become nauseated, try consuming smaller amounts or stretch out the time between glasses.  Stop for 30 minutes & slowly start back drinking.  Call our office with any questions or concerns at [phone].  Thank  You,  Christ Kick, [place]

## 2020-12-11 NOTE — Progress Notes (Signed)
Gave pt Clenpiq kit since she doesn't have insurance.

## 2020-12-11 NOTE — Progress Notes (Signed)
Gastroenterology Pre-Procedure Review  Request Date: 12/11/2020 Requesting Physician: Kizzie Furnish, PA @ [facility], no previous TCS  PATIENT REVIEW QUESTIONS: The patient responded to the following health history questions as indicated:    1. Diabetes Melitis: yes, type II 2. Joint replacements in the past 12 months: no 3. Major health problems in the past 3 months: yes, diabetes and managed with medication 4. Has an artificial valve or MVP: no 5. Has a defibrillator: no 6. Has been advised in past to take antibiotics in advance of a procedure like teeth cleaning: no 7. Family history of colon cancer: no  8. Alcohol Use: yes, 1 beer a week 9. Illicit drug Use: no, but takes CBD gummies 10. History of sleep apnea: no  11. History of coronary artery or other vascular stents placed within the last 12 months: no 12. History of any prior anesthesia complications: no, but has high anxiety, loss mom at age 54 went she went under anesthesia 13. Body mass index is 40.64 kg/m.    MEDICATIONS & ALLERGIES:    Patient reports the following regarding taking any blood thinners:   Plavix? no Aspirin? no Coumadin? no Brilinta? no Xarelto? no Eliquis? no Pradaxa? no Savaysa? no Effient? no  Patient confirms/reports the following medications:  Current Outpatient Medications  Medication Sig Dispense Refill  . Cyanocobalamin (VITAMIN B-12 PO) Take by mouth daily. Takes 2 tablets daily.    . metFORMIN (GLUCOPHAGE) 500 MG tablet TAKE 1 TABLET BY MOUTH TWICE DAILY WITH MEALS 60 tablet 2  . Multiple Vitamin (MULTIVITAMIN) tablet Take 1 tablet by mouth daily.    . Multiple Vitamins-Minerals (ZINC PO) Take by mouth daily.    Marland Kitchen UNABLE TO FIND Med Name: CBD gummies.  Chews one gummy at bedtime.     No current facility-administered medications for this visit.    Patient confirms/reports the following allergies:  Allergies  Allergen Reactions  . Penicillins Rash  . Sulfa Antibiotics Rash    No orders  of the defined types were placed in this encounter.   AUTHORIZATION INFORMATION Primary Insurance: [employer] Pending  SCHEDULE INFORMATION: Procedure has been scheduled as follows:  Date: 01/26/2021, Time: 10:30 Location: [facility] with Dr. Marletta Lor  This Gastroenterology Pre-Precedure Review Form is being routed to the following provider(s): Lewie Loron, NP

## 2020-12-15 NOTE — Progress Notes (Signed)
ASA 3 due to BMI, appropriate, no diabetes meds day of procedure.

## 2020-12-18 ENCOUNTER — Encounter: Payer: Self-pay | Admitting: *Deleted

## 2020-12-18 NOTE — Progress Notes (Signed)
Mailed letter to pt with diabetes medication adjustments and Pre-op letter.

## 2021-01-19 NOTE — Patient Instructions (Signed)
Toni Olsen  01/19/2021     @PREFPERIOPPHARMACY @   Your procedure is scheduled on  01/26/2021.   Report to 01/28/2021 at  0830  A.M.   Call this number if you have problems the morning of surgery:  [phone]   Remember:  Follow the diet and prep instructions given to you by the office.  DO NOT take any medications for diabetes the morning of your procedure.    Take these medicines the morning of surgery with A SIP OF WATER      None.    Do not wear jewelry, make-up or nail polish.  Do not wear lotions, powders, or perfumes, or deodorant.  Do not shave 48 hours prior to surgery.  Men may shave face and neck.  Do not bring valuables to the hospital.  [facility] is not responsible for any belongings or valuables.  Contacts, dentures or bridgework may not be worn into surgery.  Leave your suitcase in the car.  After surgery it may be brought to your room.  For patients admitted to the hospital, discharge time will be determined by your treatment team.  Patients discharged the day of surgery will not be allowed to drive home and must have someone with them for 24 hours.    Special instructions:     DO NOT smoke tobacco or vape for 24 hours before your procedure.  Please read over the following fact sheets that you were given. Anesthesia Post-op Instructions and Care and Recovery After Surgery      Colonoscopy, Adult, Care After This sheet gives you information about how to care for yourself after your procedure. Your health care provider may also give you more specific instructions. If you have problems or questions, contact your health careprovider. What can I expect after the procedure? After the procedure, it is common to have: A small amount of blood in your stool for 24 hours after the procedure. Some gas. Mild cramping or bloating of your abdomen. Follow these instructions at home: Eating and drinking  Drink enough fluid to keep your urine pale  yellow. Follow instructions from your health care provider about eating or drinking restrictions. Resume your normal diet as instructed by your health care provider. Avoid heavy or fried foods that are hard to digest.  Activity Rest as told by your health care provider. Avoid sitting for a long time without moving. Get up to take short walks every 1-2 hours. This is important to improve blood flow and breathing. Ask for help if you feel weak or unsteady. Return to your normal activities as told by your health care provider. Ask your health care provider what activities are safe for you. Managing cramping and bloating  Try walking around when you have cramps or feel bloated. Apply heat to your abdomen as told by your health care provider. Use the heat source that your health care provider recommends, such as a moist heat pack or a heating pad. Place a towel between your skin and the heat source. Leave the heat on for 20-30 minutes. Remove the heat if your skin turns bright red. This is especially important if you are unable to feel pain, heat, or cold. You may have a greater risk of getting burned.  General instructions If you were given a sedative during the procedure, it can affect you for several hours. Do not drive or operate machinery until your health care provider says that it is safe. For  the first 24 hours after the procedure: Do not sign important documents. Do not drink alcohol. Do your regular daily activities at a slower pace than normal. Eat soft foods that are easy to digest. Take over-the-counter and prescription medicines only as told by your health care provider. Keep all follow-up visits as told by your health care provider. This is important. Contact a health care provider if: You have blood in your stool 2-3 days after the procedure. Get help right away if you have: More than a small spotting of blood in your stool. Large blood clots in your stool. Swelling of your  abdomen. Nausea or vomiting. A fever. Increasing pain in your abdomen that is not relieved with medicine. Summary After the procedure, it is common to have a small amount of blood in your stool. You may also have mild cramping and bloating of your abdomen. If you were given a sedative during the procedure, it can affect you for several hours. Do not drive or operate machinery until your health care provider says that it is safe. Get help right away if you have a lot of blood in your stool, nausea or vomiting, a fever, or increased pain in your abdomen. This information is not intended to replace advice given to you by your health care provider. Make sure you discuss any questions you have with your healthcare provider. Document Revised: 06/22/2019 Document Reviewed: 01/22/2019 Elsevier Patient Education  2022 Elsevier Inc. Monitored Anesthesia Care, Care After This sheet gives you information about how to care for yourself after your procedure. Your health care provider may also give you more specific instructions. If you have problems or questions, contact your health careprovider. What can I expect after the procedure? After the procedure, it is common to have: Tiredness. Forgetfulness about what happened after the procedure. Impaired judgment for important decisions. Nausea or vomiting. Some difficulty with balance. Follow these instructions at home: For the time period you were told by your health care provider:     Rest as needed. Do not participate in activities where you could fall or become injured. Do not drive or use machinery. Do not drink alcohol. Do not take sleeping pills or medicines that cause drowsiness. Do not make important decisions or sign legal documents. Do not take care of children on your own. Eating and drinking Follow the diet that is recommended by your health care provider. Drink enough fluid to keep your urine pale yellow. If you vomit: Drink water,  juice, or soup when you can drink without vomiting. Make sure you have little or no nausea before eating solid foods. General instructions Have a responsible adult stay with you for the time you are told. It is important to have someone help care for you until you are awake and alert. Take over-the-counter and prescription medicines only as told by your health care provider. If you have sleep apnea, surgery and certain medicines can increase your risk for breathing problems. Follow instructions from your health care provider about wearing your sleep device: Anytime you are sleeping, including during daytime naps. While taking prescription pain medicines, sleeping medicines, or medicines that make you drowsy. Avoid smoking. Keep all follow-up visits as told by your health care provider. This is important. Contact a health care provider if: You keep feeling nauseous or you keep vomiting. You feel light-headed. You are still sleepy or having trouble with balance after 24 hours. You develop a rash. You have a fever. You have redness or swelling around  the IV site. Get help right away if: You have trouble breathing. You have new-onset confusion at home. Summary For several hours after your procedure, you may feel tired. You may also be forgetful and have poor judgment. Have a responsible adult stay with you for the time you are told. It is important to have someone help care for you until you are awake and alert. Rest as told. Do not drive or operate machinery. Do not drink alcohol or take sleeping pills. Get help right away if you have trouble breathing, or if you suddenly become confused. This information is not intended to replace advice given to you by your health care provider. Make sure you discuss any questions you have with your healthcare provider. Document Revised: 03/13/2020 Document Reviewed: 05/31/2019 Elsevier Patient Education  2022 [employer].

## 2021-01-21 ENCOUNTER — Encounter (HOSPITAL_COMMUNITY): Payer: Self-pay

## 2021-01-21 ENCOUNTER — Encounter (HOSPITAL_COMMUNITY)
Admission: RE | Admit: 2021-01-21 | Discharge: 2021-01-21 | Disposition: A | Discharge status: Home / Self Care | Payer: Self-pay | Source: Ambulatory Visit | Attending: Internal Medicine | Admitting: Internal Medicine

## 2021-01-21 NOTE — Pre-Procedure Instructions (Signed)
Interoffice note to Mindy @ [facility] because patient was a no show for PAT.

## 2021-01-22 ENCOUNTER — Telehealth: Payer: Self-pay | Admitting: *Deleted

## 2021-01-22 NOTE — Telephone Encounter (Signed)
-----   Message from Elsie Amis, RN sent at 01/21/2021  1:21 PM EDT ----- Regarding: no show Hey there! Toni Olsen did not show for her PAT today.

## 2021-01-22 NOTE — Telephone Encounter (Signed)
Spoke to pt.  She informed me that she didn't receive her letter from me.  Dorann Ou and she had opening for Pre-op appt tomorrow at 8:00. Called pt back and she agreed to opening tomorrow.  Eber Jones made aware.

## 2021-01-23 ENCOUNTER — Encounter (HOSPITAL_COMMUNITY)
Admission: RE | Admit: 2021-01-23 | Discharge: 2021-01-23 | Disposition: A | Discharge status: Home / Self Care | Payer: Self-pay | Source: Ambulatory Visit | Attending: Internal Medicine | Admitting: Internal Medicine

## 2021-01-23 ENCOUNTER — Other Ambulatory Visit: Payer: Self-pay

## 2021-01-23 ENCOUNTER — Encounter (HOSPITAL_COMMUNITY): Payer: Self-pay

## 2021-01-23 DIAGNOSIS — Z01812 Encounter for preprocedural laboratory examination: Secondary | ICD-10-CM | POA: Insufficient documentation

## 2021-01-23 LAB — BASIC METABOLIC PANEL
Anion gap: 7 (ref 5–15)
BUN: 13 mg/dL (ref 6–20)
CO2: 25 mmol/L (ref 22–32)
Calcium: 9.8 mg/dL (ref 8.9–10.3)
Chloride: 101 mmol/L (ref 98–111)
Creatinine, Ser: 0.68 mg/dL (ref 0.44–1.00)
GFR, Estimated: >60 mL/min (ref >60)
Glucose, Bld: 276 mg/dL — ABNORMAL HIGH (ref 70–99)
Potassium: 4.2 mmol/L (ref 3.5–5.1)
Sodium: 133 mmol/L — ABNORMAL LOW (ref 135–145)

## 2021-01-26 ENCOUNTER — Encounter (HOSPITAL_COMMUNITY): Payer: Self-pay

## 2021-01-26 ENCOUNTER — Ambulatory Visit (HOSPITAL_COMMUNITY)
Admission: RE | Admit: 2021-01-26 | Discharge: 2021-01-26 | Disposition: A | Discharge status: Home / Self Care | Payer: Self-pay | Attending: Internal Medicine | Admitting: Internal Medicine

## 2021-01-26 ENCOUNTER — Other Ambulatory Visit: Payer: Self-pay

## 2021-01-26 ENCOUNTER — Encounter (HOSPITAL_COMMUNITY)
Admission: RE | Disposition: A | Discharge status: Home / Self Care | Payer: Self-pay | Source: Home / Self Care | Attending: Internal Medicine

## 2021-01-26 ENCOUNTER — Ambulatory Visit (HOSPITAL_COMMUNITY): Payer: Self-pay | Admitting: Anesthesiology

## 2021-01-26 DIAGNOSIS — K648 Other hemorrhoids: Secondary | ICD-10-CM | POA: Insufficient documentation

## 2021-01-26 DIAGNOSIS — Z1211 Encounter for screening for malignant neoplasm of colon: Secondary | ICD-10-CM | POA: Insufficient documentation

## 2021-01-26 HISTORY — PX: COLONOSCOPY WITH PROPOFOL: SHX5780

## 2021-01-26 LAB — GLUCOSE, CAPILLARY: Glucose-Capillary: 273 mg/dL — ABNORMAL HIGH (ref 70–99)

## 2021-01-26 SURGERY — COLONOSCOPY WITH PROPOFOL
Anesthesia: General

## 2021-01-26 MED ORDER — LACTATED RINGERS IV SOLN: INTRAVENOUS | Status: DC

## 2021-01-26 MED ORDER — STERILE WATER FOR IRRIGATION IR SOLN
Status: DC | PRN
  Administered 2021-01-26: 100 mL

## 2021-01-26 MED ORDER — PROPOFOL 10 MG/ML IV BOLUS
INTRAVENOUS | Status: DC | PRN
  Administered 2021-01-26: 100 mg via INTRAVENOUS
  Administered 2021-01-26: 125 ug/kg/min via INTRAVENOUS

## 2021-01-26 MED ORDER — LIDOCAINE HCL (CARDIAC) PF 100 MG/5ML IV SOSY
PREFILLED_SYRINGE | INTRAVENOUS | Status: DC | PRN
  Administered 2021-01-26: 30 mg via INTRAVENOUS

## 2021-01-26 NOTE — Anesthesia Postprocedure Evaluation (Signed)
Anesthesia Post Note  Patient: Toni Olsen  Procedure(s) Performed: COLONOSCOPY WITH PROPOFOL  Patient location during evaluation: Phase II Anesthesia Type: General Level of consciousness: awake and alert and oriented Pain management: pain level controlled Vital Signs Assessment: post-procedure vital signs reviewed and stable Respiratory status: spontaneous breathing and respiratory function stable Cardiovascular status: blood pressure returned to baseline and stable Postop Assessment: no apparent nausea or vomiting Anesthetic complications: no   No notable events documented.   Last Vitals:  Vitals:   01/26/21 0947 01/26/21 1035  BP: 123/80 110/60  Pulse: 85 82  Resp: 12 18  Temp: 36.6 C 36.7 C  SpO2: 97% 97%    Last Pain:  Vitals:   01/26/21 1035  TempSrc: Oral  PainSc: 0-No pain                 Tommy Goostree C Allycia Pitz

## 2021-01-26 NOTE — Transfer of Care (Signed)
Immediate Anesthesia Transfer of Care Note  Patient: Toni Olsen  Procedure(s) Performed: COLONOSCOPY WITH PROPOFOL  Patient Location: Short Stay  Anesthesia Type:General  Level of Consciousness: awake, alert , oriented and patient cooperative  Airway & Oxygen Therapy: Patient Spontanous Breathing  Post-op Assessment: Report given to RN, Post -op Vital signs reviewed and stable and Patient moving all extremities  Post vital signs: Reviewed and stable  Last Vitals:  Vitals Value Taken Time  BP    Temp    Pulse    Resp    SpO2      Last Pain:  Vitals:   01/26/21 1012  TempSrc:   PainSc: 0-No pain         Complications: No notable events documented.

## 2021-01-26 NOTE — Op Note (Signed)
[facility] Patient Name: Toni Olsen Procedure Date: 01/26/2021 10:02 AM MRN: [id number] Date of Birth: 1966/01/28 Attending MD: Elon Alas. Abbey Chatters DO CSN: [id number] Age: 55 Admit Type: Outpatient Procedure:                Colonoscopy Indications:              Screening for colorectal malignant neoplasm Providers:                Elon Alas. Abbey Chatters, DO, Tammy Vaught, RN, Randa Spike, Technician Referring MD:              Medicines:                See the Anesthesia note for documentation of the                            administered medications Complications:            No immediate complications. Estimated Blood Loss:     Estimated blood loss: none. Procedure:                Pre-Anesthesia Assessment:                           - The anesthesia plan was to use monitored                            anesthesia care (MAC).                           After obtaining informed consent, the colonoscope                            was passed under direct vision. Throughout the                            procedure, the patient's blood pressure, pulse, and                            oxygen saturations were monitored continuously. The                            [id number]-[id number] ([id number]) scope was introduced through                            the anus and advanced to the the cecum, identified                            by appendiceal orifice and ileocecal valve. The                            colonoscopy was performed without difficulty. The                            patient tolerated the procedure well. The quality  of the bowel preparation was evaluated using the                            BBPS [facility] Bowel Preparation Scale) with scores                            of: Right Colon = 3, Transverse Colon = 3 and Left                            Colon = 3 (entire mucosa seen well with no residual                            staining, small  fragments of stool or opaque                            liquid). The total BBPS score equals 9. Scope In: 10:15:51 AM Scope Out: 10:28:09 AM Scope Withdrawal Time: 0 hours 9 minutes 55 seconds  Total Procedure Duration: 0 hours 12 minutes 18 seconds  Findings:      The perianal and digital rectal examinations were normal.      Non-bleeding internal hemorrhoids were found during endoscopy.      The entire examined colon appeared normal. Impression:               - Non-bleeding internal hemorrhoids.                           - The entire examined colon is normal.                           - No specimens collected. Moderate Sedation:      Per Anesthesia Care Recommendation:           - Patient has a contact number available for                            emergencies. The signs and symptoms of potential                            delayed complications were discussed with the                            patient. Return to normal activities tomorrow.                            Written discharge instructions were provided to the                            patient.                           - Resume previous diet.                           - Continue present medications.                           -  Repeat colonoscopy in 10 years for screening                            purposes.                           - Return to GI clinic PRN. Procedure Code(s):        --- Professional ---                           K3276, Colorectal cancer screening; colonoscopy on                            individual not meeting criteria for high risk Diagnosis Code(s):        --- Professional ---                           Z12.11, Encounter for screening for malignant                            neoplasm of colon                           K64.8, Other hemorrhoids CPT copyright 2019 American Medical Association. All rights reserved. The codes documented in this report are preliminary and upon coder review may  be  revised to meet current compliance requirements. Elon Alas. Abbey Chatters, DO [place] Abbey Chatters, DO 01/26/2021 10:29:52 AM This report has been signed electronically. Number of Addenda: 0

## 2021-01-26 NOTE — Discharge Instructions (Signed)
°  Colonoscopy °Discharge Instructions ° °Read the instructions outlined below and refer to this sheet in the next few weeks. These discharge instructions provide you with general information on caring for yourself after you leave the hospital. Your doctor may also give you specific instructions. While your treatment has been planned according to the most current medical practices available, unavoidable complications occasionally occur.  ° °ACTIVITY °You may resume your regular activity, but move at a slower pace for the next 24 hours.  °Take frequent rest periods for the next 24 hours.  °Walking will help get rid of the air and reduce the bloated feeling in your belly (abdomen).  °No driving for 24 hours (because of the medicine (anesthesia) used during the test).   °Do not sign any important legal documents or operate any machinery for 24 hours (because of the anesthesia used during the test).  °NUTRITION °Drink plenty of fluids.  °You may resume your normal diet as instructed by your doctor.  °Begin with a light meal and progress to your normal diet. Heavy or fried foods are harder to digest and may make you feel sick to your stomach (nauseated).  °Avoid alcoholic beverages for 24 hours or as instructed.  °MEDICATIONS °You may resume your normal medications unless your doctor tells you otherwise.  °WHAT YOU CAN EXPECT TODAY °Some feelings of bloating in the abdomen.  °Passage of more gas than usual.  °Spotting of blood in your stool or on the toilet paper.  °IF YOU HAD POLYPS REMOVED DURING THE COLONOSCOPY: °No aspirin products for 7 days or as instructed.  °No alcohol for 7 days or as instructed.  °Eat a soft diet for the next 24 hours.  °FINDING OUT THE RESULTS OF YOUR TEST °Not all test results are available during your visit. If your test results are not back during the visit, make an appointment with your caregiver to find out the results. Do not assume everything is normal if you have not heard from your  caregiver or the medical facility. It is important for you to follow up on all of your test results.  °SEEK IMMEDIATE MEDICAL ATTENTION IF: °You have more than a spotting of blood in your stool.  °Your belly is swollen (abdominal distention).  °You are nauseated or vomiting.  °You have a temperature over 101.  °You have abdominal pain or discomfort that is severe or gets worse throughout the day.  ° °Your colonoscopy was relatively unremarkable.  I did not find any polyps or evidence of colon cancer.  I recommend repeating colonoscopy in 10 years for colon cancer screening purposes.   Follow-up with GI as needed. ° ° °I hope you have a great rest of your week! ° °Danelia Snodgrass K. Lennyn Gange, D.O. °Gastroenterology and Hepatology °[facility] ° °

## 2021-01-26 NOTE — Anesthesia Preprocedure Evaluation (Signed)
Anesthesia Evaluation  Patient identified by MRN, date of birth, ID band Patient awake    Reviewed: Allergy & Precautions, NPO status , Patient's Chart, lab work & pertinent test results  History of Anesthesia Complications Negative for: history of anesthetic complications  Airway Mallampati: II  TM Distance: >3 FB Neck ROM: Full    Dental  (+) Dental Advisory Given   Pulmonary neg pulmonary ROS,    Pulmonary exam normal breath sounds clear to auscultation       Cardiovascular Exercise Tolerance: Good hypertension, Pt. on medications Normal cardiovascular exam+ dysrhythmias Atrial Fibrillation  Rhythm:Regular Rate:Normal     Neuro/Psych  Neuromuscular disease negative psych ROS   GI/Hepatic negative GI ROS, Neg liver ROS,   Endo/Other  diabetes, Well Controlled, Type 2, Oral Hypoglycemic Agents  Renal/GU negative Renal ROS     Musculoskeletal negative musculoskeletal ROS (+)   Abdominal   Peds  Hematology negative hematology ROS (+)   Anesthesia Other Findings   Reproductive/Obstetrics negative OB ROS                             Anesthesia Physical Anesthesia Plan  ASA: 2  Anesthesia Plan: General   Post-op Pain Management:    Induction: Intravenous  PONV Risk Score and Plan: Propofol infusion  Airway Management Planned: Nasal Cannula and Natural Airway  Additional Equipment:   Intra-op Plan:   Post-operative Plan:   Informed Consent: I have reviewed the patients History and Physical, chart, labs and discussed the procedure including the risks, benefits and alternatives for the proposed anesthesia with the patient or authorized representative who has indicated his/her understanding and acceptance.     Dental advisory given  Plan Discussed with: Surgeon  Anesthesia Plan Comments:         Anesthesia Quick Evaluation

## 2021-01-26 NOTE — H&P (Signed)
Primary Care Physician:  Tylene Fantasia., PA-C Primary Gastroenterologist:  Dr. Marletta Lor  Pre-Procedure History & Physical: HPI:  Toni Olsen is a 55 y.o. female is here for a colonoscopy for colon cancer screening purposes.  Patient denies any family history of colorectal cancer.  No melena or hematochezia.  No abdominal pain or unintentional weight loss.  No change in bowel habits.  Overall feels well from a GI standpoint.  Past Medical History:  Diagnosis Date   Atrial fibrillation (HCC)    isolated - setting of decongestant & stimulant use   Diabetes mellitus without complication (HCC)    type 2   Hypertension    Obesity     Past Surgical History:  Procedure Laterality Date   ABDOMINAL HYSTERECTOMY  1995   CESAREAN SECTION  1990   CHOLECYSTECTOMY  2002   NM MYOCAR PERF WALL MOTION  04/2007   bruce myoview; normal pattern of perfusion to all regions; post-stress EF 70%; normal, low risk scan    Sleep Study  06/2004   mild OSA/hypopnea syndrome   TRANSTHORACIC ECHOCARDIOGRAM  04/2007   EF=>55%, mild asymmetric LVH; trace MR/TR    Prior to Admission medications   Medication Sig Start Date End Date Taking? Authorizing Provider  acetaminophen (TYLENOL) 500 MG tablet Take 1,500 mg by mouth daily as needed for moderate pain or headache.   Yes [provider]  Cyanocobalamin (VITAMIN B-12 PO) Take 1 tablet by mouth daily.   Yes [provider]  Menthol-Methyl Salicylate (MUSCLE RUB EX) Apply 1 application topically daily as needed (muscle pain).   Yes [provider]  metFORMIN (GLUCOPHAGE-XR) 500 MG 24 hr tablet Take 500 mg by mouth 2 (two) times daily.   Yes [provider]  Multiple Vitamin (MULTIVITAMIN) tablet Take 2 tablets by mouth daily.   Yes [provider]  Tetrahydrozoline HCl (VISINE OP) Place 1 drop into both eyes daily as needed (irritation).   Yes [provider]  UNABLE TO FIND Take 1 capsule by mouth at  bedtime. Med Name: CBD gummies.   Yes [provider]  VITAMIN D PO Take 2 capsules by mouth daily.   Yes [provider]  metFORMIN (GLUCOPHAGE) 500 MG tablet TAKE 1 TABLET BY MOUTH TWICE DAILY WITH MEALS Patient not taking: No sig reported 02/21/19   Roma Kayser, MD    Allergies as of 12/18/2020 - Review Complete 12/11/2020  Allergen Reaction Noted   Penicillins Rash 07/30/2012   Sulfa antibiotics Rash 07/30/2012    Family History  Problem Relation Age of Onset   Breast cancer Mother 60   Kidney disease Mother        kidney abscess   Diabetes Father        prostate cancer   Renal cancer Sister    Sickle cell trait Sister    Hypertension Brother    Healthy Son     Social History   Socioeconomic History   Marital status: Divorced    Spouse name: Not on file   Number of children: 1   Years of education: 12   Highest education level: Not on file  Occupational History   Occupation: Scientist, research (medical): SELF-EMPLOYED  Tobacco Use   Smoking status: Never   Smokeless tobacco: Never  Vaping Use   Vaping Use: Never used  Substance and Sexual Activity   Alcohol use: Yes    Alcohol/week: 0.0 standard drinks    Comment: occasional   Drug use:  No   Sexual activity: Yes  Other Topics Concern   Not on file  Social History Narrative   Not on file   Social Determinants of Health   Financial Resource Strain: Not on file  Food Insecurity: Not on file  Transportation Needs: Not on file  Physical Activity: Not on file  Stress: Not on file  Social Connections: Not on file  Intimate Partner Violence: Not on file    Review of Systems: See HPI, otherwise negative ROS  Physical Exam: Vital signs in last 24 hours:     General:   Alert,  Well-developed, well-nourished, pleasant and cooperative in NAD Head:  Normocephalic and atraumatic. Eyes:  Sclera clear, no icterus.   Conjunctiva pink. Ears:  Normal auditory acuity. Nose:  No deformity,  discharge,  or lesions. Mouth:  No deformity or lesions, dentition normal. Neck:  Supple; no masses or thyromegaly. Lungs:  Clear throughout to auscultation.   No wheezes, crackles, or rhonchi. No acute distress. Heart:  Regular rate and rhythm; no murmurs, clicks, rubs,  or gallops. Abdomen:  Soft, nontender and nondistended. No masses, hepatosplenomegaly or hernias noted. Normal bowel sounds, without guarding, and without rebound.   Msk:  Symmetrical without gross deformities. Normal posture. Extremities:  Without clubbing or edema. Neurologic:  Alert and  oriented x4;  grossly normal neurologically. Skin:  Intact without significant lesions or rashes. Cervical Nodes:  No significant cervical adenopathy. Psych:  Alert and cooperative. Normal mood and affect.  Impression/Plan: Toni Olsen is here for a colonoscopy to be performed for colon cancer screening purposes.  The risks of the procedure including infection, bleed, or perforation as well as benefits, limitations, alternatives and imponderables have been reviewed with the patient. Questions have been answered. All parties agreeable.

## 2021-01-30 ENCOUNTER — Encounter (HOSPITAL_COMMUNITY): Payer: Self-pay | Admitting: Internal Medicine

## 2023-04-04 IMAGING — MG MM DIGITAL SCREENING BILAT W/ TOMO AND CAD
8 of 15 series · 8 of 40 positions shown · non-contrast
Comparison: Previous exam(s).

CLINICAL DATA: Screening.

EXAM:
DIGITAL SCREENING BILATERAL MAMMOGRAM WITH TOMOSYNTHESIS AND CAD
TECHNIQUE: Bilateral screening digital craniocaudal and mediolateral oblique
mammograms were obtained. Bilateral screening digital breast
tomosynthesis was performed. The images were evaluated with
computer-aided detection.

[R MLO synth-2D (1 of 2)]
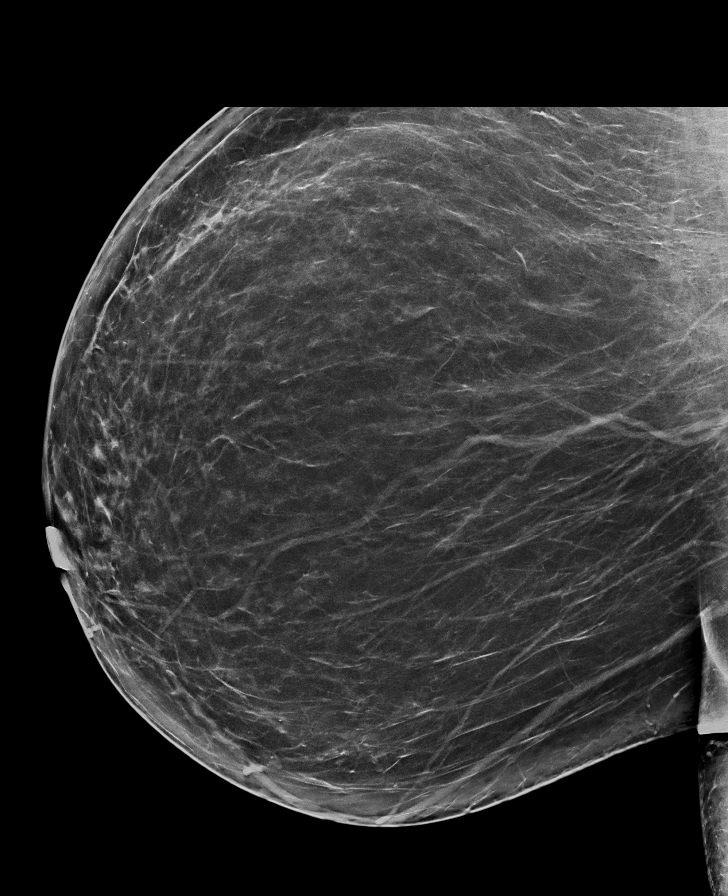

[L MLO synth-2D (1 of 2)]
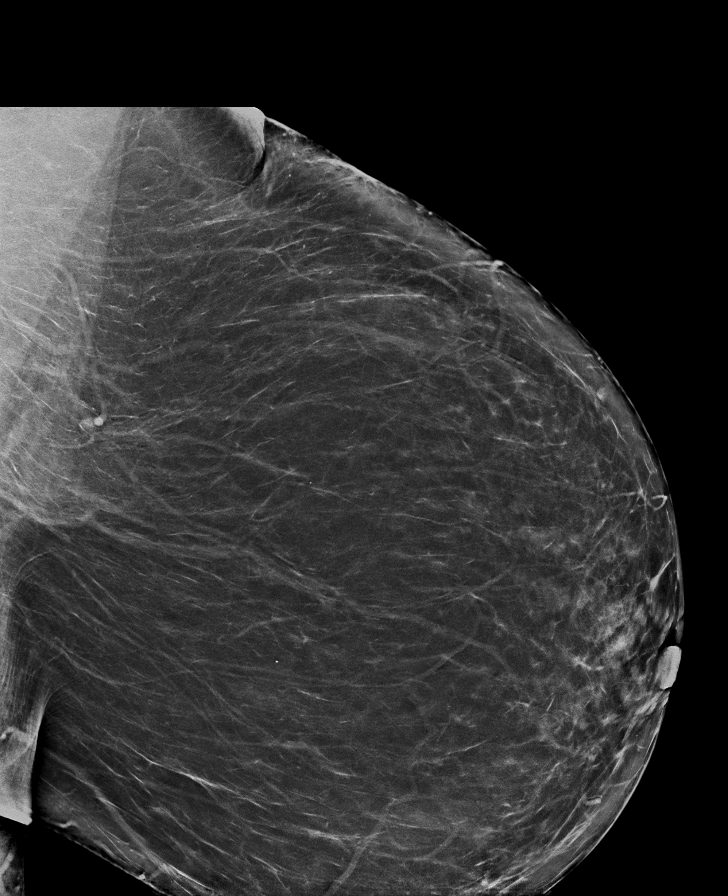

[L MLO synth-2D (2 of 2)]
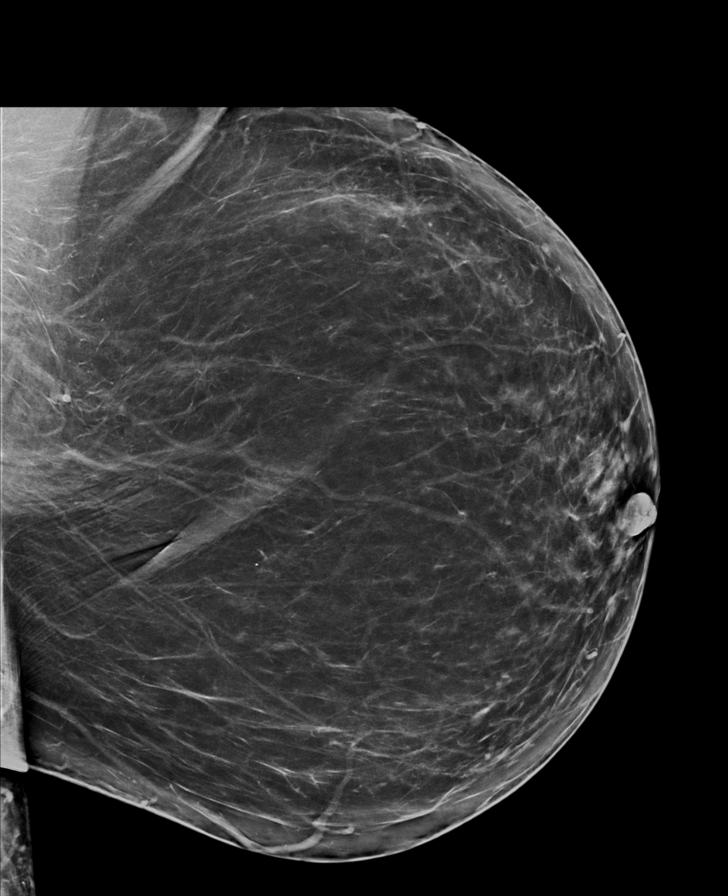

[L CC synth-2D]
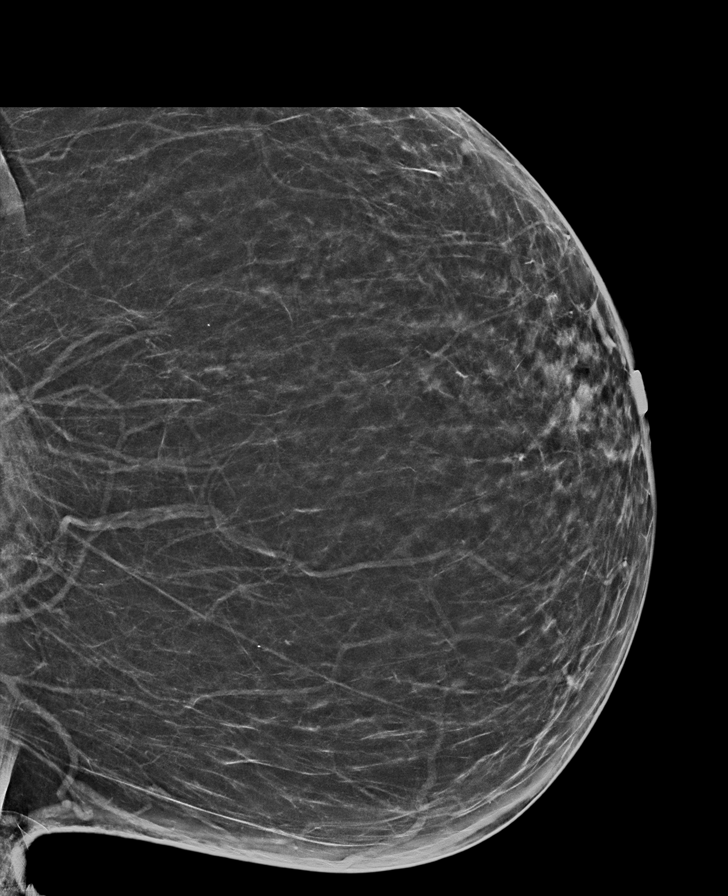

[R CC synth-2D (1 of 2)]
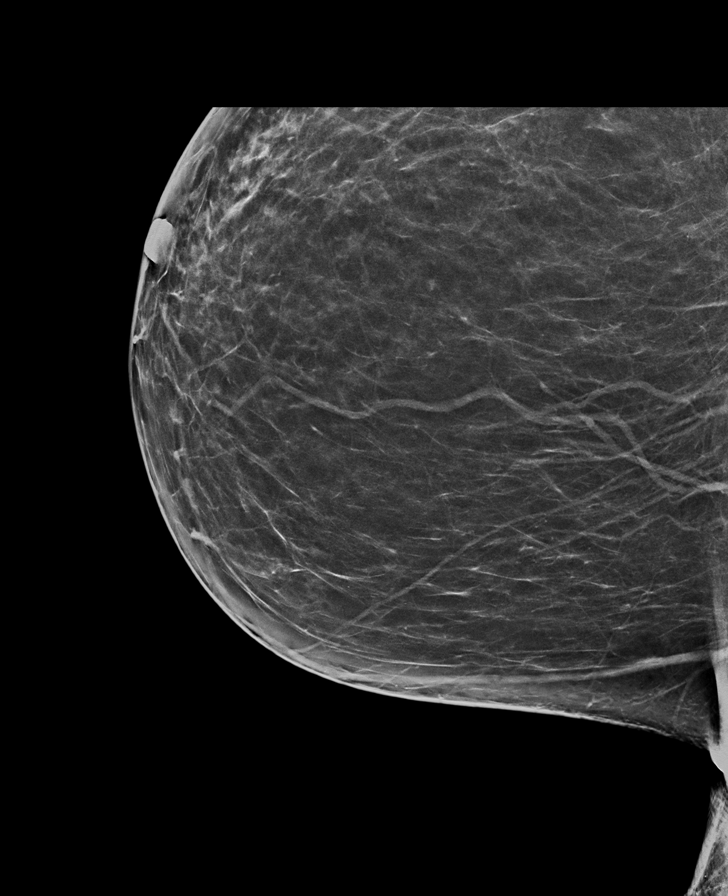

[R CC synth-2D (2 of 2)]
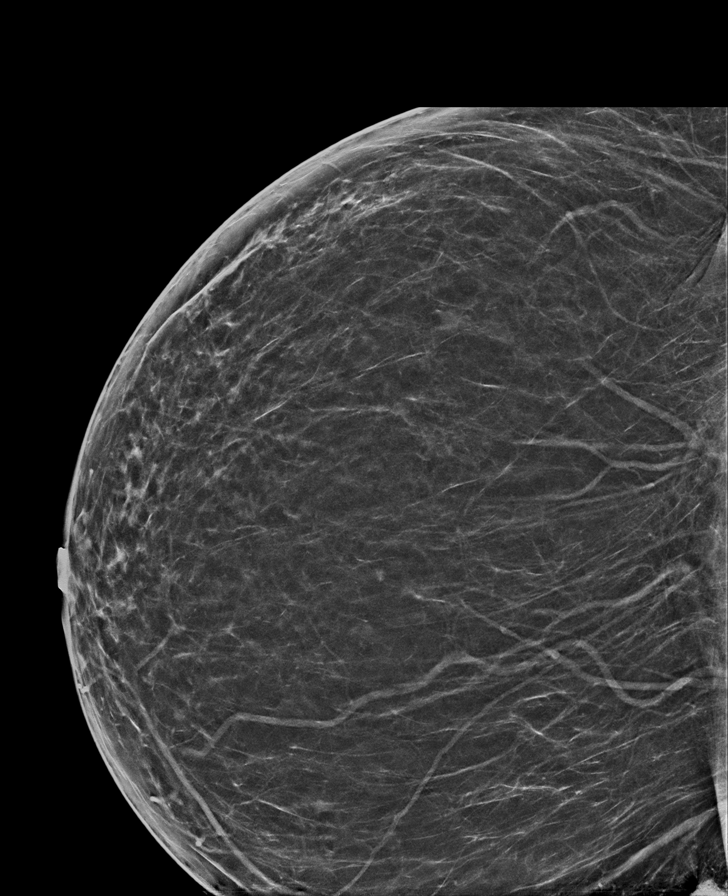

[R MLO synth-2D (2 of 2)]
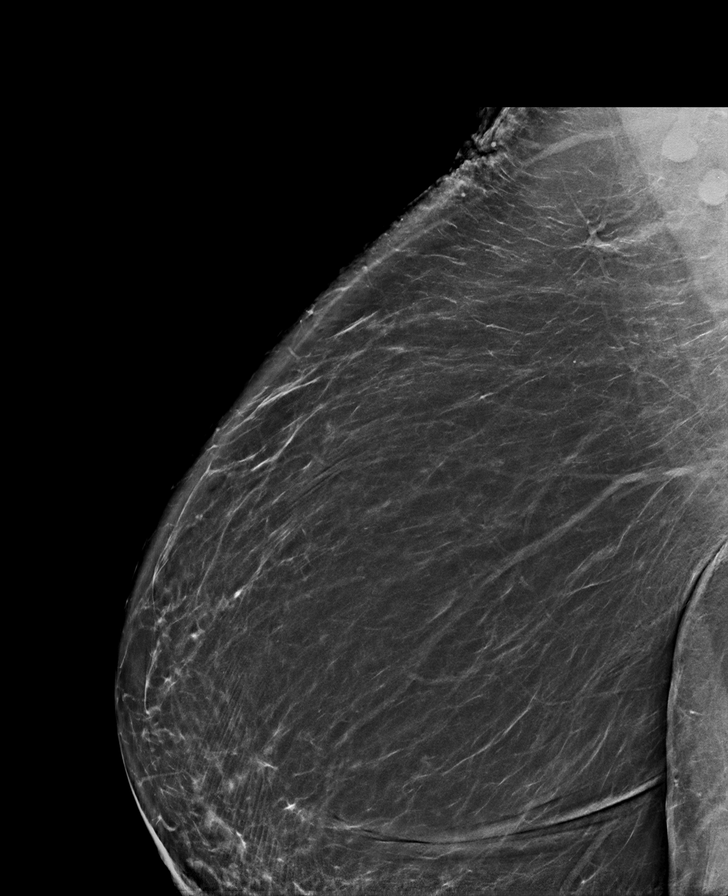

[L CC tomo · tomo slice 49/71.0]
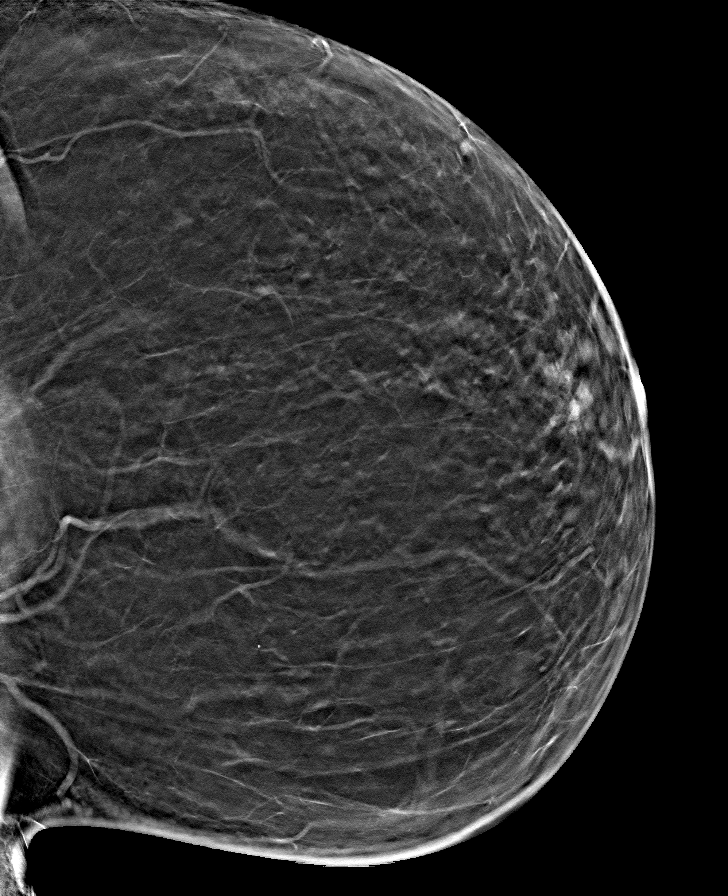

[8 of 40 positions shown; findings below may reference images not displayed]

ACR Breast Density Category b: There are scattered areas of
fibroglandular density.
FINDINGS: There are no findings suspicious for malignancy. The images were
evaluated with computer-aided detection.
IMPRESSION: No mammographic evidence of malignancy. A result letter of this
screening mammogram will be mailed directly to the patient.

RECOMMENDATION:
Screening mammogram in one year. (Code:WJ-I-BG6)

BI-RADS CATEGORY  1: Negative.

## 2023-04-12 ENCOUNTER — Other Ambulatory Visit: Payer: Self-pay

## 2023-04-12 ENCOUNTER — Encounter (HOSPITAL_COMMUNITY): Payer: Self-pay | Admitting: Emergency Medicine

## 2023-04-12 ENCOUNTER — Emergency Department (HOSPITAL_COMMUNITY)
Admission: EM | Admit: 2023-04-12 | Discharge: 2023-04-12 | Disposition: A | Discharge status: Home / Self Care | Payer: Self-pay | Attending: Emergency Medicine | Admitting: Emergency Medicine

## 2023-04-12 ENCOUNTER — Emergency Department (HOSPITAL_COMMUNITY): Payer: Self-pay

## 2023-04-12 DIAGNOSIS — Z7984 Long term (current) use of oral hypoglycemic drugs: Secondary | ICD-10-CM | POA: Insufficient documentation

## 2023-04-12 DIAGNOSIS — E119 Type 2 diabetes mellitus without complications: Secondary | ICD-10-CM | POA: Insufficient documentation

## 2023-04-12 DIAGNOSIS — R112 Nausea with vomiting, unspecified: Secondary | ICD-10-CM

## 2023-04-12 DIAGNOSIS — R0602 Shortness of breath: Secondary | ICD-10-CM | POA: Insufficient documentation

## 2023-04-12 DIAGNOSIS — K5900 Constipation, unspecified: Secondary | ICD-10-CM

## 2023-04-12 DIAGNOSIS — E876 Hypokalemia: Secondary | ICD-10-CM | POA: Insufficient documentation

## 2023-04-12 LAB — COMPREHENSIVE METABOLIC PANEL
ALT: 16 U/L (ref 0–44)
AST: 15 U/L (ref 15–41)
Albumin: 4.7 g/dL (ref 3.5–5.0)
Alkaline Phosphatase: 94 U/L (ref 38–126)
Anion gap: 15 (ref 5–15)
BUN: 8 mg/dL (ref 6–20)
CO2: 22 mmol/L (ref 22–32)
Calcium: 10.8 mg/dL — ABNORMAL HIGH (ref 8.9–10.3)
Chloride: 99 mmol/L (ref 98–111)
Creatinine, Ser: 0.72 mg/dL (ref 0.44–1.00)
GFR, Estimated: >60 mL/min (ref >60)
Glucose, Bld: 247 mg/dL — ABNORMAL HIGH (ref 70–99)
Potassium: 3 mmol/L — ABNORMAL LOW (ref 3.5–5.1)
Sodium: 136 mmol/L (ref 135–145)
Total Bilirubin: 0.7 mg/dL (ref 0.3–1.2)
Total Protein: 9.4 g/dL — ABNORMAL HIGH (ref 6.5–8.1)

## 2023-04-12 LAB — CBC WITH DIFFERENTIAL/PLATELET
Abs Immature Granulocytes: 0.03 10*3/uL (ref 0.00–0.07)
Basophils Absolute: 0 10*3/uL (ref 0.0–0.1)
Basophils Relative: 0 %
Eosinophils Absolute: 0 10*3/uL (ref 0.0–0.5)
Eosinophils Relative: 0 %
HCT: 48.3 % — ABNORMAL HIGH (ref 36.0–46.0)
Hemoglobin: 16 g/dL — ABNORMAL HIGH (ref 12.0–15.0)
Immature Granulocytes: 0 %
Lymphocytes Relative: 15 %
Lymphs Abs: 1.2 10*3/uL (ref 0.7–4.0)
MCH: 26.7 pg (ref 26.0–34.0)
MCHC: 33.1 g/dL (ref 30.0–36.0)
MCV: 80.5 fL (ref 80.0–100.0)
Monocytes Absolute: 0.4 10*3/uL (ref 0.1–1.0)
Monocytes Relative: 5 %
Neutro Abs: 6.3 10*3/uL (ref 1.7–7.7)
Neutrophils Relative %: 80 %
Platelets: 368 10*3/uL (ref 150–400)
RBC: 6 MIL/uL — ABNORMAL HIGH (ref 3.87–5.11)
RDW: 12.9 % (ref 11.5–15.5)
WBC: 7.9 10*3/uL (ref 4.0–10.5)
nRBC: 0 % (ref 0.0–0.2)

## 2023-04-12 LAB — LIPASE, BLOOD: Lipase: 36 U/L (ref 11–51)

## 2023-04-12 MED ORDER — SODIUM CHLORIDE 0.9 % IV BOLUS
1000.0000 mL | Freq: Once | INTRAVENOUS | Status: AC
  Administered 2023-04-12: 1000 mL via INTRAVENOUS

## 2023-04-12 MED ORDER — ONDANSETRON HCL 4 MG/2ML IJ SOLN
4.0000 mg | Freq: Once | INTRAMUSCULAR | Status: AC
  Administered 2023-04-12: 4 mg via INTRAVENOUS
  Filled 2023-04-12: qty 2

## 2023-04-12 MED ORDER — ONDANSETRON 4 MG PO TBDP: 4.0000 mg | ORAL_TABLET | Freq: Three times a day (TID) | ORAL | 0 refills | Status: AC | PRN

## 2023-04-12 NOTE — ED Provider Notes (Signed)
[place] EMERGENCY DEPARTMENT AT [facility] Provider Note   CSN: [id number] Arrival date & time: 04/12/23  0865     History {Add pertinent medical, surgical, social history, OB history to HPI:1} Chief Complaint  Patient presents with   Shortness of Breath    Toni Olsen is a 57 y.o. female.  She has a history of diabetes, paroxysmal A-fib.  She said she has had nausea and vomiting and constipation for the last 3 to 4 days.  She has been trying some oral laxatives without improvement.  She said she sometimes feels short of breath and her heart races when she vomits but otherwise feels fine from that standpoint.  No fevers or chills no urinary symptoms.  She said she always has a little hard time with bowel movements but has never been like this before.  She denies any abdominal pain but does feel bloated.  No rectal pain no rectal bleeding.  The history is provided by the patient.  Emesis Severity:  Moderate Duration:  3 days Timing:  Intermittent Quality:  Stomach contents Progression:  Unchanged Chronicity:  New Recent urination:  Normal Relieved by:  None tried Ineffective treatments:  Liquids Associated symptoms: no abdominal pain, no cough, no diarrhea and no fever   Risk factors: diabetes        Home Medications Prior to Admission medications   Medication Sig Start Date End Date Taking? Authorizing Provider  acetaminophen (TYLENOL) 500 MG tablet Take 1,500 mg by mouth daily as needed for moderate pain or headache.    [provider]  Cyanocobalamin (VITAMIN B-12 PO) Take 1 tablet by mouth daily.    [provider]  Menthol-Methyl Salicylate (MUSCLE RUB EX) Apply 1 application topically daily as needed (muscle pain).    [provider]  metFORMIN (GLUCOPHAGE) 500 MG tablet TAKE 1 TABLET BY MOUTH TWICE DAILY WITH MEALS Patient not taking: No sig reported 02/21/19   Roma Kayser, MD  metFORMIN (GLUCOPHAGE-XR) 500 MG 24 hr  tablet Take 500 mg by mouth 2 (two) times daily.    [provider]  Multiple Vitamin (MULTIVITAMIN) tablet Take 2 tablets by mouth daily.    [provider]  Tetrahydrozoline HCl (VISINE OP) Place 1 drop into both eyes daily as needed (irritation).    [provider]  UNABLE TO FIND Take 1 capsule by mouth at bedtime. Med Name: CBD gummies.    [provider]  VITAMIN D PO Take 2 capsules by mouth daily.    [provider]      Allergies    Atorvastatin, Penicillins, and Sulfa antibiotics    Review of Systems   Review of Systems  Constitutional:  Negative for fever.  Respiratory:  Positive for shortness of breath. Negative for cough.   Cardiovascular:  Positive for palpitations. Negative for chest pain.  Gastrointestinal:  Positive for abdominal distention, constipation, nausea and vomiting. Negative for abdominal pain and diarrhea.  Genitourinary:  Negative for dysuria.    Physical Exam Updated Vital Signs BP (!) 172/102 (BP Location: Right Arm)   Pulse 98   Temp 98.2 F (36.8 C) (Oral)   Resp 20   Ht 5\' 4"  (1.626 m)   Wt 89.4 kg   SpO2 99%   BMI 33.81 kg/m  Physical Exam Vitals and nursing note reviewed.  Constitutional:      General: She is not in acute distress.    Appearance: She is well-developed.  HENT:  Head: Normocephalic and atraumatic.  Eyes:     Conjunctiva/sclera: Conjunctivae normal.  Cardiovascular:     Rate and Rhythm: Normal rate and regular rhythm.     Heart sounds: No murmur heard. Pulmonary:     Effort: Pulmonary effort is normal. No respiratory distress.     Breath sounds: Normal breath sounds.  Abdominal:     Palpations: Abdomen is soft.     Tenderness: There is no abdominal tenderness. There is no guarding or rebound.  Musculoskeletal:        General: No swelling. Normal range of motion.     Cervical back: Neck supple.     Right lower leg: No edema.     Left lower leg: No edema.  Skin:     General: Skin is warm and dry.     Capillary Refill: Capillary refill takes less than 2 seconds.  Neurological:     General: No focal deficit present.     Mental Status: She is alert.     ED Results / Procedures / Treatments   Labs (all labs ordered are listed, but only abnormal results are displayed) Labs Reviewed - No data to display  EKG EKG Interpretation Date/Time:  Tuesday April 12 2023 18:20:47 EDT Ventricular Rate:  100 PR Interval:  154 QRS Duration:  56 QT Interval:  344 QTC Calculation: 443 R Axis:   6  Text Interpretation: Sinus rhythm with Premature atrial complexes Septal infarct (cited on or before 12-Apr-2023) Abnormal ECG When compared with ECG of 23-Jan-2021 08:13, Premature atrial complexes are now Present Confirmed by Meridee Score [phone]) on 04/12/2023 6:23:11 PM  Radiology No results found.  Procedures Procedures  {Document cardiac monitor, telemetry assessment procedure when appropriate:1}  Medications Ordered in ED Medications  sodium chloride 0.9 % bolus 1,000 mL (has no administration in time range)  ondansetron (ZOFRAN) injection 4 mg (has no administration in time range)    ED Course/ Medical Decision Making/ A&P Clinical Course as of 04/12/23 1916  Tue Apr 12, 2023  1823 EKG not crossing in epic.  Sinus rhythm with PACs rate of 100 no acute ST-T changes. [MB]    Clinical Course User Index [MB] Terrilee Files, MD   {   Click here for ABCD2, HEART and other calculatorsREFRESH Note before signing :1}                              Medical Decision Making Amount and/or Complexity of Data Reviewed Labs: ordered. Radiology: ordered.  Risk Prescription drug management.   This patient complains of ***; this involves an extensive number of treatment Options and is a complaint that carries with it a high risk of complications and morbidity. The differential includes ***  I ordered, reviewed and interpreted labs, which included  *** I ordered medication *** and reviewed PMP when indicated. I ordered imaging studies which included *** and I independently    visualized and interpreted imaging which showed *** Additional history obtained from *** Previous records obtained and reviewed *** I consulted *** and discussed lab and imaging findings and discussed disposition.  Cardiac monitoring reviewed, *** Social determinants considered, *** Critical Interventions: ***  After the interventions stated above, I reevaluated the patient and found *** Admission and further testing considered, ***   {Document critical care time when appropriate:1} {Document review of labs and clinical decision tools ie heart score, Chads2Vasc2 etc:1}  {Document your independent review of radiology images,  and any outside records:1} {Document your discussion with family members, caretakers, and with consultants:1} {Document social determinants of health affecting pt's care:1} {Document your decision making why or why not admission, treatments were needed:1} Final Clinical Impression(s) / ED Diagnoses Final diagnoses:  None    Rx / DC Orders ED Discharge Orders     None

## 2023-04-12 NOTE — Discharge Instructions (Signed)
You are seen in the emergency department for constipation nausea vomiting.  Your lab work showed your potassium to be mildly low and your x-rays did not show any obvious obstruction.  Unfortunately CAT scan was not available at [facility] and you were offered transfer to [facility] for further evaluation.  You felt comfortable with going home and will return for imaging if any worsening of your situation.  Included below are some recommendations to try for your constipation.  Please follow up with your primary care doctor within 2-3 days. For constipation we also recommend a diet high in fiber (beans, fruits, vegetables, whole grains). Take Colace 100-200 mg up to three times per day. You may take along with Senokot 1-2 tabs, ingest with full glass of water.  You may also take MiraLAX 1-2 capfuls 1-2 times a day until stools become soft and then slowly decrease the amount of MiraLAX used.  Maintain fluid intake 6-8 glasses per day. Please increase fibers in your diet. You may also take Milk of Magnesia 30 mL as needed for constipation, you may repeat in 2 hours again if no bowl movement.

## 2023-04-12 NOTE — ED Triage Notes (Signed)
Pt presents with SOB that started last night, also constipation x 3 days, with emesis.

## 2023-04-21 ENCOUNTER — Ambulatory Visit (INDEPENDENT_AMBULATORY_CARE_PROVIDER_SITE_OTHER): Payer: Self-pay | Admitting: Internal Medicine

## 2023-04-21 ENCOUNTER — Encounter: Payer: Self-pay | Admitting: Internal Medicine

## 2023-04-21 VITALS — BP 150/85 | HR 77 | Temp 98.6°F | Ht 63.5 in | Wt 202.8 lb

## 2023-04-21 DIAGNOSIS — G8929 Other chronic pain: Secondary | ICD-10-CM

## 2023-04-21 DIAGNOSIS — R1110 Vomiting, unspecified: Secondary | ICD-10-CM

## 2023-04-21 DIAGNOSIS — R112 Nausea with vomiting, unspecified: Secondary | ICD-10-CM

## 2023-04-21 DIAGNOSIS — R1319 Other dysphagia: Secondary | ICD-10-CM

## 2023-04-21 DIAGNOSIS — R1310 Dysphagia, unspecified: Secondary | ICD-10-CM

## 2023-04-21 DIAGNOSIS — R1013 Epigastric pain: Secondary | ICD-10-CM

## 2023-04-21 NOTE — Progress Notes (Signed)
Referring Provider: Health, Matthew Folks* Primary Care Physician:  [facility], [facility] Primary GI:  Dr. Marletta Lor  Chief Complaint  Patient presents with   New Patient (Initial Visit)    Follow up on ED visit for nausea and vomiting    HPI:   Toni Olsen is a 57 y.o. female who presents to the clinic today for ER follow-up visit.  Patient states she was in her normal state of health until approximately 1 month ago when she had worsening esophageal dysphagia.  Felt like food was just "sit" in her epigastric region.  This has led to recurrent vomiting.  She has had 2 ER visits on 04/12/2023 and 04/15/2023.  Most recent ER visit, blood work largely unremarkable.  CT abdomen pelvis with contrast also largely unremarkable besides sigmoid diverticulosis.  Symptoms occurring daily.  She now has to cut her food into very small pieces and drink plenty water with each bite.  Denies any heartburn or acid reflux.  Does note epigastric pain primarily after she eats.  Mild to moderate in severity.  No previous upper endoscopy.  In regards to colon cancer screening, last colonoscopy 01/26/2021 unremarkable besides internal hemorrhoids, recommended 10-year recall.  Past Medical History:  Diagnosis Date   Atrial fibrillation (HCC)    isolated - setting of decongestant & stimulant use   Diabetes mellitus without complication (HCC)    type 2   Hypertension    Obesity     Past Surgical History:  Procedure Laterality Date   ABDOMINAL HYSTERECTOMY  1995   CESAREAN SECTION  1990   CHOLECYSTECTOMY  2002   COLONOSCOPY WITH PROPOFOL N/A 01/26/2021   Procedure: COLONOSCOPY WITH PROPOFOL;  Surgeon: [name], DO;  Location: [facility];  Service: Endoscopy;  Laterality: N/A;  ASA III / 10:30   NM MYOCAR PERF WALL MOTION  04/2007   bruce myoview; normal pattern of perfusion to all regions; post-stress EF 70%; normal, low risk scan    Sleep Study  06/2004   mild OSA/hypopnea  syndrome   TRANSTHORACIC ECHOCARDIOGRAM  04/2007   EF=>55%, mild asymmetric LVH; trace MR/TR    Current Outpatient Medications  Medication Sig Dispense Refill   acetaminophen (TYLENOL) 500 MG tablet Take 1,500 mg by mouth daily as needed for moderate pain or headache.     Apoaequorin (PREVAGEN PO) Take 1 tablet by mouth daily.     Menthol-Methyl Salicylate (MUSCLE RUB EX) Apply 1 application topically daily as needed (muscle pain).     ondansetron (ZOFRAN-ODT) 4 MG disintegrating tablet Take 1 tablet (4 mg total) by mouth every 8 (eight) hours as needed for nausea or vomiting. 20 tablet 0   OVER THE COUNTER MEDICATION Take 1 capsule by mouth in the morning and at bedtime. Seamoss Supplement     Tetrahydrozoline HCl (VISINE OP) Place 1 drop into both eyes daily as needed (irritation).     UNABLE TO FIND Take 1 capsule by mouth at bedtime. Med Name: CBD gummies.     No current facility-administered medications for this visit.    Allergies as of 04/21/2023 - Review Complete 04/12/2023  Allergen Reaction Noted   Atorvastatin Other (See Comments) 01/14/2021   Penicillins Swelling and Rash 07/30/2012   Sulfa antibiotics Swelling and Rash 07/30/2012    Family History  Problem Relation Age of Onset   Breast cancer Mother 40   Kidney disease Mother        kidney abscess   Diabetes Father  prostate cancer   Renal cancer Sister    Sickle cell trait Sister    Hypertension Brother    Healthy Son     Social History   Socioeconomic History   Marital status: Divorced    Spouse name: Not on file   Number of children: 1   Years of education: 12   Highest education level: Not on file  Occupational History   Occupation: Scientist, research (medical): SELF-EMPLOYED  Tobacco Use   Smoking status: Never   Smokeless tobacco: Never  Vaping Use   Vaping status: Never Used  Substance and Sexual Activity   Alcohol use: Yes    Alcohol/week: 0.0 standard drinks of alcohol    Comment: occasional    Drug use: No   Sexual activity: Yes  Other Topics Concern   Not on file  Social History Narrative   Not on file   Social Determinants of Health   Financial Resource Strain: Not on file  Food Insecurity: Not on file  Transportation Needs: Not on file  Physical Activity: Not on file  Stress: Not on file  Social Connections: Not on file    Subjective: Review of Systems  Constitutional:  Negative for chills and fever.  HENT:  Negative for congestion and hearing loss.   Eyes:  Negative for blurred vision and double vision.  Respiratory:  Negative for cough and shortness of breath.   Cardiovascular:  Negative for chest pain and palpitations.  Gastrointestinal:  Positive for abdominal pain and vomiting. Negative for blood in stool, constipation, diarrhea, heartburn and melena.       Dysphagia  Genitourinary:  Negative for dysuria and urgency.  Musculoskeletal:  Negative for joint pain and myalgias.  Skin:  Negative for itching and rash.  Neurological:  Negative for dizziness and headaches.  Psychiatric/Behavioral:  Negative for depression. The patient is not nervous/anxious.      Objective: There were no vitals taken for this visit. Physical Exam Constitutional:      Appearance: Normal appearance.  HENT:     Head: Normocephalic and atraumatic.  Eyes:     Extraocular Movements: Extraocular movements intact.     Conjunctiva/sclera: Conjunctivae normal.  Cardiovascular:     Rate and Rhythm: Normal rate and regular rhythm.  Pulmonary:     Effort: Pulmonary effort is normal.     Breath sounds: Normal breath sounds.  Abdominal:     General: Bowel sounds are normal.     Palpations: Abdomen is soft.  Musculoskeletal:        General: No swelling. Normal range of motion.     Cervical back: Normal range of motion and neck supple.  Skin:    General: Skin is warm and dry.     Coloration: Skin is not jaundiced.  Neurological:     General: No focal deficit present.     Mental  Status: She is alert and oriented to person, place, and time.  Psychiatric:        Mood and Affect: Mood normal.        Behavior: Behavior normal.      Assessment: *Esophageal dysphagia  *Epigastric pain *Vomiting  Plan: Will schedule for EGD with possible dilation to evaluate for peptic ulcer disease, esophagitis, gastritis, H. Pylori, duodenitis, or other. Will also evaluate for esophageal stricture, Schatzki's ring, esophageal web or other.   The risks including infection, bleed, or perforation as well as benefits, limitations, alternatives and imponderables have been reviewed with the patient. Potential for  esophageal dilation, biopsy, etc. have also been reviewed.  Questions have been answered. All parties agreeable.  Continue on Omeprazole 20 mg daily. May make adjustments pending endoscopic findings.   Follow up after procedure.    04/21/2023 1:42 PM   Disclaimer: This note was dictated with voice recognition software. Similar sounding words can inadvertently be transcribed and may not be corrected upon review.

## 2023-04-21 NOTE — H&P (View-Only) (Signed)
Referring Provider: Health, Matthew Folks* Primary Care Physician:  Health, [facility] Primary GI:  Dr. Marletta Lor  Chief Complaint  Patient presents with   New Patient (Initial Visit)    Follow up on ED visit for nausea and vomiting    HPI:   Toni Olsen is a 57 y.o. female who presents to the clinic today for ER follow-up visit.  Patient states she was in her normal state of health until approximately 1 month ago when she had worsening esophageal dysphagia.  Felt like food was just "sit" in her epigastric region.  This has led to recurrent vomiting.  She has had 2 ER visits on 04/12/2023 and 04/15/2023.  Most recent ER visit, blood work largely unremarkable.  CT abdomen pelvis with contrast also largely unremarkable besides sigmoid diverticulosis.  Symptoms occurring daily.  She now has to cut her food into very small pieces and drink plenty water with each bite.  Denies any heartburn or acid reflux.  Does note epigastric pain primarily after she eats.  Mild to moderate in severity.  No previous upper endoscopy.  In regards to colon cancer screening, last colonoscopy 01/26/2021 unremarkable besides internal hemorrhoids, recommended 10-year recall.  Past Medical History:  Diagnosis Date   Atrial fibrillation (HCC)    isolated - setting of decongestant & stimulant use   Diabetes mellitus without complication (HCC)    type 2   Hypertension    Obesity     Past Surgical History:  Procedure Laterality Date   ABDOMINAL HYSTERECTOMY  1995   CESAREAN SECTION  1990   CHOLECYSTECTOMY  2002   COLONOSCOPY WITH PROPOFOL N/A 01/26/2021   Procedure: COLONOSCOPY WITH PROPOFOL;  Surgeon: [name], DO;  Location: [facility];  Service: Endoscopy;  Laterality: N/A;  ASA III / 10:30   NM MYOCAR PERF WALL MOTION  04/2007   bruce myoview; normal pattern of perfusion to all regions; post-stress EF 70%; normal, low risk scan    Sleep Study  06/2004   mild OSA/hypopnea  syndrome   TRANSTHORACIC ECHOCARDIOGRAM  04/2007   EF=>55%, mild asymmetric LVH; trace MR/TR    Current Outpatient Medications  Medication Sig Dispense Refill   acetaminophen (TYLENOL) 500 MG tablet Take 1,500 mg by mouth daily as needed for moderate pain or headache.     Apoaequorin (PREVAGEN PO) Take 1 tablet by mouth daily.     Menthol-Methyl Salicylate (MUSCLE RUB EX) Apply 1 application topically daily as needed (muscle pain).     ondansetron (ZOFRAN-ODT) 4 MG disintegrating tablet Take 1 tablet (4 mg total) by mouth every 8 (eight) hours as needed for nausea or vomiting. 20 tablet 0   OVER THE COUNTER MEDICATION Take 1 capsule by mouth in the morning and at bedtime. Seamoss Supplement     Tetrahydrozoline HCl (VISINE OP) Place 1 drop into both eyes daily as needed (irritation).     UNABLE TO FIND Take 1 capsule by mouth at bedtime. Med Name: CBD gummies.     No current facility-administered medications for this visit.    Allergies as of 04/21/2023 - Review Complete 04/12/2023  Allergen Reaction Noted   Atorvastatin Other (See Comments) 01/14/2021   Penicillins Swelling and Rash 07/30/2012   Sulfa antibiotics Swelling and Rash 07/30/2012    Family History  Problem Relation Age of Onset   Breast cancer Mother 40   Kidney disease Mother        kidney abscess   Diabetes Father  prostate cancer   Renal cancer Sister    Sickle cell trait Sister    Hypertension Brother    Healthy Son     Social History   Socioeconomic History   Marital status: Divorced    Spouse name: Not on file   Number of children: 1   Years of education: 12   Highest education level: Not on file  Occupational History   Occupation: Scientist, research (medical): SELF-EMPLOYED  Tobacco Use   Smoking status: Never   Smokeless tobacco: Never  Vaping Use   Vaping status: Never Used  Substance and Sexual Activity   Alcohol use: Yes    Alcohol/week: 0.0 standard drinks of alcohol    Comment: occasional    Drug use: No   Sexual activity: Yes  Other Topics Concern   Not on file  Social History Narrative   Not on file   Social Determinants of Health   Financial Resource Strain: Not on file  Food Insecurity: Not on file  Transportation Needs: Not on file  Physical Activity: Not on file  Stress: Not on file  Social Connections: Not on file    Subjective: Review of Systems  Constitutional:  Negative for chills and fever.  HENT:  Negative for congestion and hearing loss.   Eyes:  Negative for blurred vision and double vision.  Respiratory:  Negative for cough and shortness of breath.   Cardiovascular:  Negative for chest pain and palpitations.  Gastrointestinal:  Positive for abdominal pain and vomiting. Negative for blood in stool, constipation, diarrhea, heartburn and melena.       Dysphagia  Genitourinary:  Negative for dysuria and urgency.  Musculoskeletal:  Negative for joint pain and myalgias.  Skin:  Negative for itching and rash.  Neurological:  Negative for dizziness and headaches.  Psychiatric/Behavioral:  Negative for depression. The patient is not nervous/anxious.      Objective: There were no vitals taken for this visit. Physical Exam Constitutional:      Appearance: Normal appearance.  HENT:     Head: Normocephalic and atraumatic.  Eyes:     Extraocular Movements: Extraocular movements intact.     Conjunctiva/sclera: Conjunctivae normal.  Cardiovascular:     Rate and Rhythm: Normal rate and regular rhythm.  Pulmonary:     Effort: Pulmonary effort is normal.     Breath sounds: Normal breath sounds.  Abdominal:     General: Bowel sounds are normal.     Palpations: Abdomen is soft.  Musculoskeletal:        General: No swelling. Normal range of motion.     Cervical back: Normal range of motion and neck supple.  Skin:    General: Skin is warm and dry.     Coloration: Skin is not jaundiced.  Neurological:     General: No focal deficit present.     Mental  Status: She is alert and oriented to person, place, and time.  Psychiatric:        Mood and Affect: Mood normal.        Behavior: Behavior normal.      Assessment: *Esophageal dysphagia  *Epigastric pain *Vomiting  Plan: Will schedule for EGD with possible dilation to evaluate for peptic ulcer disease, esophagitis, gastritis, H. Pylori, duodenitis, or other. Will also evaluate for esophageal stricture, Schatzki's ring, esophageal web or other.   The risks including infection, bleed, or perforation as well as benefits, limitations, alternatives and imponderables have been reviewed with the patient. Potential for  esophageal dilation, biopsy, etc. have also been reviewed.  Questions have been answered. All parties agreeable.  Continue on Omeprazole 20 mg daily. May make adjustments pending endoscopic findings.   Follow up after procedure.    04/21/2023 1:42 PM   Disclaimer: This note was dictated with voice recognition software. Similar sounding words can inadvertently be transcribed and may not be corrected upon review.

## 2023-04-21 NOTE — Patient Instructions (Signed)
We will schedule you for upper endoscopy to further evaluate your feeling of food getting stuck in your chest, vomiting, abdominal pain.  I may elect to stretch your esophagus depending on findings.  Continue on omeprazole daily.  We may increase this depending on endoscopic findings.  Recommend you avoid tough textures. All meats should be chopped finely. Eat slowly, take small bites, chew thoroughly, and drink plenty of liquids throughout meals. If something were to get hung in your esophagus and not come up or go down, you should proceed to the emergency room.  It was very nice seeing you again today.  Dr. Marletta Lor

## 2023-04-22 ENCOUNTER — Telehealth: Payer: Self-pay | Admitting: *Deleted

## 2023-04-22 NOTE — Telephone Encounter (Signed)
CALLED PT, LMOVM to call back to offered date 10/22 for EGD/DIL with Dr. Marletta Lor, ASA 2

## 2023-04-22 NOTE — Telephone Encounter (Signed)
Pt called back. She has been scheduled for 10/22 at 2pm. Discussed EGD instructions in detail with patient over the phone.

## 2023-05-03 ENCOUNTER — Ambulatory Visit (HOSPITAL_BASED_OUTPATIENT_CLINIC_OR_DEPARTMENT_OTHER): Payer: Self-pay | Admitting: Anesthesiology

## 2023-05-03 ENCOUNTER — Ambulatory Visit (HOSPITAL_COMMUNITY)
Admission: RE | Admit: 2023-05-03 | Discharge: 2023-05-03 | Disposition: A | Discharge status: Home / Self Care | Payer: Self-pay | Attending: Internal Medicine | Admitting: Internal Medicine

## 2023-05-03 ENCOUNTER — Other Ambulatory Visit: Payer: Self-pay

## 2023-05-03 ENCOUNTER — Ambulatory Visit (HOSPITAL_COMMUNITY): Payer: Self-pay | Admitting: Anesthesiology

## 2023-05-03 ENCOUNTER — Encounter (HOSPITAL_COMMUNITY)
Admission: RE | Disposition: A | Discharge status: Home / Self Care | Payer: Self-pay | Source: Home / Self Care | Attending: Internal Medicine

## 2023-05-03 DIAGNOSIS — R112 Nausea with vomiting, unspecified: Secondary | ICD-10-CM

## 2023-05-03 DIAGNOSIS — K449 Diaphragmatic hernia without obstruction or gangrene: Secondary | ICD-10-CM

## 2023-05-03 DIAGNOSIS — R1314 Dysphagia, pharyngoesophageal phase: Secondary | ICD-10-CM | POA: Insufficient documentation

## 2023-05-03 DIAGNOSIS — B9681 Helicobacter pylori [H. pylori] as the cause of diseases classified elsewhere: Secondary | ICD-10-CM

## 2023-05-03 DIAGNOSIS — R1310 Dysphagia, unspecified: Secondary | ICD-10-CM

## 2023-05-03 DIAGNOSIS — K222 Esophageal obstruction: Secondary | ICD-10-CM

## 2023-05-03 DIAGNOSIS — I10 Essential (primary) hypertension: Secondary | ICD-10-CM

## 2023-05-03 DIAGNOSIS — K2970 Gastritis, unspecified, without bleeding: Secondary | ICD-10-CM

## 2023-05-03 DIAGNOSIS — E119 Type 2 diabetes mellitus without complications: Secondary | ICD-10-CM

## 2023-05-03 DIAGNOSIS — Z6835 Body mass index (BMI) 35.0-35.9, adult: Secondary | ICD-10-CM | POA: Insufficient documentation

## 2023-05-03 DIAGNOSIS — R1013 Epigastric pain: Secondary | ICD-10-CM

## 2023-05-03 DIAGNOSIS — A048 Other specified bacterial intestinal infections: Secondary | ICD-10-CM

## 2023-05-03 DIAGNOSIS — K2950 Unspecified chronic gastritis without bleeding: Secondary | ICD-10-CM

## 2023-05-03 HISTORY — PX: BALLOON DILATION: SHX5330

## 2023-05-03 HISTORY — PX: BIOPSY: SHX5522

## 2023-05-03 HISTORY — PX: ESOPHAGOGASTRODUODENOSCOPY (EGD) WITH PROPOFOL: SHX5813

## 2023-05-03 SURGERY — ESOPHAGOGASTRODUODENOSCOPY (EGD) WITH PROPOFOL
Anesthesia: General

## 2023-05-03 MED ORDER — PANTOPRAZOLE SODIUM 40 MG PO TBEC: 40.0000 mg | DELAYED_RELEASE_TABLET | Freq: Two times a day (BID) | ORAL | 11 refills | Status: DC

## 2023-05-03 MED ORDER — PROPOFOL 10 MG/ML IV BOLUS
INTRAVENOUS | Status: DC | PRN
  Administered 2023-05-03: 100 mg via INTRAVENOUS

## 2023-05-03 MED ORDER — SODIUM CHLORIDE 0.9 % IV SOLN: INTRAVENOUS | Status: DC | PRN

## 2023-05-03 MED ORDER — LIDOCAINE HCL (CARDIAC) PF 100 MG/5ML IV SOSY
PREFILLED_SYRINGE | INTRAVENOUS | Status: DC | PRN
  Administered 2023-05-03: 60 mg via INTRATRACHEAL

## 2023-05-03 MED ORDER — PROPOFOL 500 MG/50ML IV EMUL
INTRAVENOUS | Status: DC | PRN
  Administered 2023-05-03: 200 ug/kg/min via INTRAVENOUS

## 2023-05-03 NOTE — Op Note (Signed)
[facility] Patient Name: Toni Olsen Procedure Date: 05/03/2023 1:39 PM MRN: [id number] Date of Birth: Jan 13, 1966 Attending MD: Hennie Duos. Marletta Lor , [place], [id number] CSN: [id number] Age: 57 Admit Type: Outpatient Procedure:                Upper GI endoscopy Indications:              Dysphagia, Heartburn Providers:                Hennie Duos. Marletta Lor, DO, Emilee Tubb RN, RN, Crystal                            Page, Roseanne Kaufman, Pensions consultant Referring MD:              Medicines:                See the Anesthesia note for documentation of the                            administered medications Complications:            No immediate complications. Estimated Blood Loss:     Estimated blood loss was minimal. Procedure:                Pre-Anesthesia Assessment:                           - The anesthesia plan was to use monitored                            anesthesia care (MAC).                           After obtaining informed consent, the endoscope was                            passed under direct vision. Throughout the                            procedure, the patient's blood pressure, pulse, and                            oxygen saturations were monitored continuously. The                            GIF-H190 ([id number]) scope was introduced through the                            mouth, and advanced to the second part of duodenum.                            The upper GI endoscopy was accomplished without                            difficulty. The patient tolerated the procedure                            well.  Scope In: 1:46:22 PM Scope Out: 1:51:21 PM Total Procedure Duration: 0 hours 4 minutes 59 seconds  Findings:      A 3 cm hiatal hernia was present.      A mild Schatzki ring was found in the distal esophagus. A TTS dilator       was passed through the scope. Dilation with an 18-19-20 mm balloon       dilator was performed to 20 mm. The dilation site was examined and        showed moderate improvement in luminal narrowing.      Patchy mild inflammation characterized by erythema was found in the       gastric body and in the gastric antrum. Biopsies were taken with a cold       forceps for Helicobacter pylori testing.      The duodenal bulb, first portion of the duodenum and second portion of       the duodenum were normal. Impression:               - 3 cm hiatal hernia.                           - Mild Schatzki ring. Dilated.                           - Gastritis. Biopsied.                           - Normal duodenal bulb, first portion of the                            duodenum and second portion of the duodenum. Moderate Sedation:      Per Anesthesia Care Recommendation:           - Patient has a contact number available for                            emergencies. The signs and symptoms of potential                            delayed complications were discussed with the                            patient. Return to normal activities tomorrow.                            Written discharge instructions were provided to the                            patient.                           - Resume previous diet.                           - Continue present medications.                           - Await pathology results.                           -  Repeat upper endoscopy PRN for retreatment.                           - Return to GI clinic in 3 months.                           - Use Protonix (pantoprazole) 40 mg PO BID for 12                            weeks. Procedure Code(s):        --- Professional ---                           [phone], Esophagogastroduodenoscopy, flexible,                            transoral; with transendoscopic balloon dilation of                            esophagus (less than 30 mm diameter)                           43239, 59, Esophagogastroduodenoscopy, flexible,                            transoral; with biopsy, single or  multiple Diagnosis Code(s):        --- Professional ---                           K44.9, Diaphragmatic hernia without obstruction or                            gangrene                           K22.2, Esophageal obstruction                           K29.70, Gastritis, unspecified, without bleeding                           R13.10, Dysphagia, unspecified                           R12, Heartburn CPT copyright 2022 American Medical Association. All rights reserved. The codes documented in this report are preliminary and upon coder review may  be revised to meet current compliance requirements. Hennie Duos. Marletta Lor, DO Hennie Duos. Marletta Lor, DO 05/03/2023 1:54:36 PM This report has been signed electronically. Number of Addenda: 0

## 2023-05-03 NOTE — Discharge Instructions (Addendum)
EGD Discharge instructions Please read the instructions outlined below and refer to this sheet in the next few weeks. These discharge instructions provide you with general information on caring for yourself after you leave the hospital. Your doctor may also give you specific instructions. While your treatment has been planned according to the most current medical practices available, unavoidable complications occasionally occur. If you have any problems or questions after discharge, please call your doctor. ACTIVITY You may resume your regular activity but move at a slower pace for the next 24 hours.  Take frequent rest periods for the next 24 hours.  Walking will help expel (get rid of) the air and reduce the bloated feeling in your abdomen.  No driving for 24 hours (because of the anesthesia (medicine) used during the test).  You may shower.  Do not sign any important legal documents or operate any machinery for 24 hours (because of the anesthesia used during the test).  NUTRITION Drink plenty of fluids.  You may resume your normal diet.  Begin with a light meal and progress to your normal diet.  Avoid alcoholic beverages for 24 hours or as instructed by your caregiver.  MEDICATIONS You may resume your normal medications unless your caregiver tells you otherwise.  WHAT YOU CAN EXPECT TODAY You may experience abdominal discomfort such as a feeling of fullness or "gas" pains.  FOLLOW-UP Your doctor will discuss the results of your test with you.  SEEK IMMEDIATE MEDICAL ATTENTION IF ANY OF THE FOLLOWING OCCUR: Excessive nausea (feeling sick to your stomach) and/or vomiting.  Severe abdominal pain and distention (swelling).  Trouble swallowing.  Temperature over 101 F (37.8 C).  Rectal bleeding or vomiting of blood.   Your EGD revealed mild amount inflammation in your stomach.  I took biopsies of this to rule out infection with a bacteria called H. pylori.  Await pathology results, my  office will contact you.  You have a small hiatal hernia.  You also had a tightening of your esophagus called a Schatzki's ring.  I stretched this out today.  Hopefully this helps with the feeling of food getting stuck in your chest.  Small bowel appeared normal.  I am going to change your omeprazole to pantoprazole 40 mg twice daily for the next 12 weeks at which point you can decrease down to once daily.  Follow-up with GI in 3 months.   I hope you have a great rest of your week!  Hennie Duos. Marletta Lor, D.O. Gastroenterology and Hepatology [facility]

## 2023-05-03 NOTE — Anesthesia Postprocedure Evaluation (Signed)
Anesthesia Post Note  Patient: Toni Olsen  Procedure(s) Performed: ESOPHAGOGASTRODUODENOSCOPY (EGD) WITH PROPOFOL BALLOON DILATION BIOPSY  Patient location during evaluation: PACU Anesthesia Type: General Level of consciousness: awake and alert Pain management: pain level controlled Vital Signs Assessment: post-procedure vital signs reviewed and stable Respiratory status: spontaneous breathing, nonlabored ventilation, respiratory function stable and patient connected to nasal cannula oxygen Cardiovascular status: blood pressure returned to baseline and stable Postop Assessment: no apparent nausea or vomiting Anesthetic complications: no   There were no known notable events for this encounter.   Last Vitals:  Vitals:   05/03/23 1237 05/03/23 1356  BP: 139/81 125/63  Pulse: 66 76  Resp: 17 16  Temp:  36.7 C  SpO2: 97% 96%    Last Pain:  Vitals:   05/03/23 1356  TempSrc: Oral  PainSc: 0-No pain                 Toni Olsen

## 2023-05-03 NOTE — Interval H&P Note (Signed)
History and Physical Interval Note:  05/03/2023 1:17 PM  Toni Olsen  has presented today for surgery, with the diagnosis of dysphagia, nausea, vomiting, epigastric pain.  The various methods of treatment have been discussed with the patient and family. After consideration of risks, benefits and other options for treatment, the patient has consented to  Procedure(s) with comments: ESOPHAGOGASTRODUODENOSCOPY (EGD) WITH PROPOFOL (N/A) - 200pm, asa 2 BALLOON DILATION (N/A) as a surgical intervention.  The patient's history has been reviewed, patient examined, no change in status, stable for surgery.  I have reviewed the patient's chart and labs.  Questions were answered to the patient's satisfaction.     [name]

## 2023-05-03 NOTE — Anesthesia Preprocedure Evaluation (Addendum)
Anesthesia Evaluation  Patient identified by MRN, date of birth, ID band Patient awake    Reviewed: Allergy & Precautions, NPO status , Patient's Chart, lab work & pertinent test results  History of Anesthesia Complications Negative for: history of anesthetic complications  Airway Mallampati: II  TM Distance: >3 FB Neck ROM: Full    Dental no notable dental hx. (+) Dental Advisory Given, Teeth Intact   Pulmonary neg pulmonary ROS   Pulmonary exam normal breath sounds clear to auscultation       Cardiovascular Exercise Tolerance: Good hypertension, Pt. on medications Normal cardiovascular exam+ dysrhythmias Atrial Fibrillation  Rhythm:Regular Rate:Normal     Neuro/Psych  Neuromuscular disease  negative psych ROS   GI/Hepatic negative GI ROS, Neg liver ROS,,,  Endo/Other  diabetes, Well Controlled, Type 2, Oral Hypoglycemic Agents  Morbid obesity  Renal/GU negative Renal ROS     Musculoskeletal negative musculoskeletal ROS (+)    Abdominal   Peds  Hematology negative hematology ROS (+)   Anesthesia Other Findings   Reproductive/Obstetrics negative OB ROS                             Anesthesia Physical Anesthesia Plan  ASA: 3  Anesthesia Plan: General   Post-op Pain Management: Minimal or no pain anticipated   Induction: Intravenous  PONV Risk Score and Plan: Propofol infusion  Airway Management Planned: Nasal Cannula and Natural Airway  Additional Equipment: None  Intra-op Plan:   Post-operative Plan:   Informed Consent: I have reviewed the patients History and Physical, chart, labs and discussed the procedure including the risks, benefits and alternatives for the proposed anesthesia with the patient or authorized representative who has indicated his/her understanding and acceptance.     Dental advisory given  Plan Discussed with: Surgeon  Anesthesia Plan Comments:          Anesthesia Quick Evaluation

## 2023-05-03 NOTE — Transfer of Care (Signed)
Immediate Anesthesia Transfer of Care Note  Patient: Toni Olsen  Procedure(s) Performed: ESOPHAGOGASTRODUODENOSCOPY (EGD) WITH PROPOFOL BALLOON DILATION BIOPSY  Patient Location: Endoscopy Unit  Anesthesia Type:General  Level of Consciousness: drowsy  Airway & Oxygen Therapy: Patient Spontanous Breathing  Post-op Assessment: Report given to RN and Post -op Vital signs reviewed and stable  Post vital signs: Reviewed and stable  Last Vitals:  Vitals Value Taken Time  BP 135/90   Temp 98   Pulse 90   Resp 16   SpO2 98     Last Pain:  Vitals:   05/03/23 1343  TempSrc:   PainSc: 0-No pain      Patients Stated Pain Goal: 5 (05/03/23 1237)  Complications: No notable events documented.

## 2023-05-04 ENCOUNTER — Other Ambulatory Visit: Payer: Self-pay | Admitting: Internal Medicine

## 2023-05-04 LAB — SURGICAL PATHOLOGY

## 2023-05-04 MED ORDER — BISMUTH 262 MG PO CHEW
2.0000 | CHEWABLE_TABLET | Freq: Four times a day (QID) | ORAL | 0 refills | Status: AC
Start: 2023-05-04 — End: 2023-05-18

## 2023-05-04 MED ORDER — METRONIDAZOLE 500 MG PO TABS: 500.0000 mg | ORAL_TABLET | Freq: Three times a day (TID) | ORAL | 0 refills | Status: AC

## 2023-05-04 MED ORDER — DOXYCYCLINE MONOHYDRATE 100 MG PO TABS: 100.0000 mg | ORAL_TABLET | Freq: Two times a day (BID) | ORAL | 0 refills | Status: AC

## 2023-05-04 NOTE — Progress Notes (Signed)
Patient has H. pylori gastritis.  I will send in 14-day course of Doxycyline 100 mg two times daily, metronidazole 500 mg 3 times daily.   Continue pantoprazole 40 mg twice daily.   I will also send in bismuth to take 4 times daily as well.    Follow-up as previously scheduled to discuss eradication testing.  Thank you

## 2023-05-09 ENCOUNTER — Telehealth: Payer: Self-pay

## 2023-05-09 NOTE — Telephone Encounter (Signed)
Dr Marletta Lor pt called and LMOVM needing something phoned in for yeast after antibiotics. Please advise.

## 2023-05-10 ENCOUNTER — Encounter (HOSPITAL_COMMUNITY): Payer: Self-pay | Admitting: Internal Medicine

## 2023-05-11 ENCOUNTER — Other Ambulatory Visit: Payer: Self-pay | Admitting: Internal Medicine

## 2023-05-11 MED ORDER — FLUCONAZOLE 150 MG PO TABS: ORAL_TABLET | ORAL | 0 refills | Status: DC

## 2023-05-11 NOTE — Telephone Encounter (Signed)
Fluconazole sent to patient's pharmacy.

## 2023-05-11 NOTE — Telephone Encounter (Signed)
Phoned the pt and LMOVM for the pt regarding her Rx being sent to her pharmacy

## 2023-08-12 ENCOUNTER — Encounter: Payer: Self-pay | Admitting: Internal Medicine

## 2024-05-02 ENCOUNTER — Encounter: Payer: Self-pay | Admitting: Adult Health

## 2024-05-11 ENCOUNTER — Ambulatory Visit (INDEPENDENT_AMBULATORY_CARE_PROVIDER_SITE_OTHER): Payer: Self-pay | Admitting: Adult Health

## 2024-05-11 ENCOUNTER — Encounter: Payer: Self-pay | Admitting: Adult Health

## 2024-05-11 VITALS — BP 148/85 | HR 70 | Ht 63.0 in | Wt 200.0 lb

## 2024-05-11 DIAGNOSIS — I10 Essential (primary) hypertension: Secondary | ICD-10-CM

## 2024-05-11 DIAGNOSIS — B9689 Other specified bacterial agents as the cause of diseases classified elsewhere: Secondary | ICD-10-CM

## 2024-05-11 DIAGNOSIS — N898 Other specified noninflammatory disorders of vagina: Secondary | ICD-10-CM | POA: Insufficient documentation

## 2024-05-11 DIAGNOSIS — K5900 Constipation, unspecified: Secondary | ICD-10-CM

## 2024-05-11 DIAGNOSIS — N760 Acute vaginitis: Secondary | ICD-10-CM

## 2024-05-11 DIAGNOSIS — R112 Nausea with vomiting, unspecified: Secondary | ICD-10-CM

## 2024-05-11 DIAGNOSIS — Z1331 Encounter for screening for depression: Secondary | ICD-10-CM

## 2024-05-11 LAB — POCT WET PREP (WET MOUNT)
Clue Cells Wet Prep Whiff POC: NEGATIVE
Trichomonas Wet Prep HPF POC: ABSENT
WBC, Wet Prep HPF POC: POSITIVE

## 2024-05-11 MED ORDER — METRONIDAZOLE 0.75 % VA GEL
1.0000 | Freq: Every day | VAGINAL | 0 refills | Status: DC
Start: 2024-05-11 — End: 2024-06-04

## 2024-05-11 NOTE — Progress Notes (Signed)
 Subjective:     Patient ID: Toni Olsen, female   DOB: Aug 29, 1965, 58 y.o.   MRN: [id number]  HPI Toni Olsen is a 58 year old black female, divorced, sp hysterectomy in complaining of vaginal discharge with odor for about 2 weeks. Was treated for UTI about 2 weeks ago, but stopped meds, it made her sick She has had nausea and vomiting and constipation and was seen at Urgent Care.  PCP is [facility]  Review of Systems +vaginal discharge with odor for about 2 weeks Had had itching and burning too +constipation, had BM yesterday little pebbles +nausea and vomiting Reviewed past medical,surgical, social and family history. Reviewed medications and allergies.     Objective:   Physical Exam BP (!) 148/85 (BP Location: Left Arm, Patient Position: Sitting, Cuff Size: Normal)   Pulse 70   Ht 5' 3 (1.6 m)   Wt 200 lb (90.7 kg)   BMI 35.43 kg/m     Skin warm and dry.Pelvic: external genitalia is normal in appearance no lesions,inner labia red, vagina: white discharge without odor,urethra has no lesions or masses noted, cervix and uterus are absent,adnexa: no masses or tenderness noted. Bladder is non tender and no masses felt. Wet prep: + for clue cells and +WBCs. AA is 1 Fall risk is moderate    05/11/2024   10:30 AM 04/25/2018    3:46 PM 03/14/2018    9:14 AM  Depression screen PHQ 2/9  Decreased Interest 3 0 0  Down, Depressed, Hopeless 2 0 0  PHQ - 2 Score 5 0 0  Altered sleeping 3    Tired, decreased energy 3    Change in appetite 3    Feeling bad or failure about yourself  2    Trouble concentrating 2    Moving slowly or fidgety/restless 0    Suicidal thoughts 0    PHQ-9 Score 18     Declines meds, not SI or HI    05/11/2024   10:30 AM  GAD 7 : Generalized Anxiety Score  Nervous, Anxious, on Edge 3  Control/stop worrying 1  Worry too much - different things 1  Trouble relaxing 0  Restless 0  Easily annoyed or irritable 1  Afraid - awful might happen 0  Total GAD 7 Score 6     Upstream - 05/11/24 1027       Pregnancy Intention Screening   Does the patient want to become pregnant in the next year? N/A    Does the patient's partner want to become pregnant in the next year? N/A    Would the patient like to discuss contraceptive options today? N/A      Contraception Wrap Up   Current Method Female Sterilization   hyst   End Method Female Sterilization   hyst   Contraception Counseling Provided No           Examination chaperoned by Clarita Salt LPN  Assessment:     1. Vaginal discharge  +white discharge - POCT Wet Prep [facility])  2. Vaginal odor No odor today but she had noticed  - POCT Wet Prep [facility])  3. BV (bacterial vaginosis) +clue cells and +WBC on wet prep Will rx metrogel  Meds ordered this encounter  Medications   metroNIDAZOLE  (METROGEL ) 0.75 % vaginal gel    Sig: Place 1 Applicatorful vaginally at bedtime. X 7 nights    Dispense:  70 g    Refill:  0    Supervising Provider:   JAYNE,  LUTHER H [[id number]]    - POCT Wet Prep [facility])  4. Constipation, unspecified constipation type Refer to Dr Cindie she has seen him in past for colonoscopy  - Ambulatory referral to Gastroenterology  5. Nausea and vomiting, unspecified vomiting type Has had nausea and vomiting and has zofran   - Ambulatory referral to Gastroenterology  6. Hypertension, unspecified type She says she has white coat syndrome, is not on meds Follow up with PCP    Plan:     Follow up prn

## 2024-05-24 ENCOUNTER — Ambulatory Visit: Payer: Self-pay | Admitting: Gastroenterology

## 2024-05-24 ENCOUNTER — Encounter: Payer: Self-pay | Admitting: Gastroenterology

## 2024-05-24 NOTE — Progress Notes (Deleted)
 GI Office Note    Referring Provider: RAYNALDO DECK PUBLI* Primary Care Physician:  [facility] Primary Gastroenterologist: Carlin POUR. Cindie, DO  Date:  05/24/2024  ID:  Toni Olsen, DOB [birth year], MRN [id number]   Chief Complaint   No chief complaint on file.  History of Present Illness  Toni Olsen is a 58 y.o. female with a history of diabetes, isolated A-fib, HTN, GERD, Schatzki's ring, and H. pylori gastritis presenting today with complaint of  ***  Colonoscopy July 2022: - Internal hemorrhoids - Entire colon normal - Repeat in 10 years  Last office visit October 2024 with Dr. Cindie.  Patient reported feeling of food sitting in her epigastric region which led to recurrent vomiting.  She had had 2 recent ED visits for the same.  Per prior notes states that CT of the abdomen pelvis with contrast was unremarkable besides sigmoid diverticulosis.  She reports symptoms were occurring daily, having to cut her food and very small pieces and drink plenty of water  with each bite.  Denied reflux or heartburn.  Scheduled for EGD with possible dilation to evaluate for cause of dysphagia.  Advise continue omeprazole 20 mg daily and may consider making adjustments pending EGD findings  EGD October 2024: - 3 cm hiatal hernia.  - Mild Schatzki ring. Dilated.  - Gastritis. Biopsied.  - Normal duodenal bulb, first portion of the duodenum and second portion of the duodenum. 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 pylori organisms identified. - She was stented in bismuth  quadruple therapy (bismuth , doxycycline , metronidazole , and pantoprazole )  Seen by GYN 05/11/2024 with reports of vaginal discharge as well as nausea, vomiting, constipation recently reporting she was seen at urgent care.  Stating she is having little pebbles of stools.  Given she has seen GI in the past, she  was given another referral to GI for her constipation, nausea, and vomiting.  Today:     Wt Readings from Last 6 Encounters:  05/11/24 200 lb (90.7 kg)  04/21/23 202 lb 12.8 oz (92 kg)  04/12/23 197 lb (89.4 kg)  01/23/21 225 lb (102.1 kg)  12/11/20 229 lb 6.4 oz (104.1 kg)  03/15/19 255 lb (115.7 kg)    There is no height or weight on file to calculate BMI.   Current Outpatient Medications  Medication Sig Dispense Refill   acetaminophen (TYLENOL) 500 MG tablet Take 1,500 mg by mouth daily as needed for moderate pain or headache.     bismuth  subsalicylate (PEPTO BISMOL) 262 MG chewable tablet Chew 524 mg by mouth as needed.     Menthol-Methyl Salicylate (MUSCLE RUB EX) Apply 1 application topically daily as needed (muscle pain).     metroNIDAZOLE  (METROGEL ) 0.75 % vaginal gel Place 1 Applicatorful vaginally at bedtime. X 7 nights 70 g 0   Omeprazole Magnesium (PRILOSEC PO) Take by mouth.     ondansetron  (ZOFRAN -ODT) 4 MG disintegrating tablet Take 1 tablet (4 mg total) by mouth every 8 (eight) hours as needed for nausea or vomiting. 20 tablet 0   Tetrahydrozoline HCl (VISINE OP) Place 1 drop into both eyes daily as needed (irritation).     Wheat Dextrin (BENEFIBER PO) Take by mouth.     No current facility-administered medications for this visit.    Past Medical History:  Diagnosis Date   Atrial fibrillation (HCC)    isolated - setting of decongestant & stimulant use  Diabetes mellitus without complication (HCC)    type 2   Hiatal hernia    Hypertension    Obesity     Past Surgical History:  Procedure Laterality Date   ABDOMINAL HYSTERECTOMY  1995   BALLOON DILATION N/A 05/03/2023   Procedure: BALLOON DILATION;  Surgeon: Cindie Carlin POUR, DO;  [facility];  Service: Endoscopy;  Laterality: N/A;   BIOPSY  05/03/2023   Procedure: BIOPSY;  Surgeon: Cindie Carlin POUR, DO;  [facility];  Service: Endoscopy;;   CESAREAN SECTION  1990    CHOLECYSTECTOMY  2002   COLONOSCOPY WITH PROPOFOL  N/A 01/26/2021   Procedure: COLONOSCOPY WITH PROPOFOL ;  Surgeon: Cindie Carlin POUR, DO;  [facility];  Service: Endoscopy;  Laterality: N/A;  ASA III / 10:30   ESOPHAGOGASTRODUODENOSCOPY (EGD) WITH PROPOFOL  N/A 05/03/2023   Procedure: ESOPHAGOGASTRODUODENOSCOPY (EGD) WITH PROPOFOL ;  Surgeon: Cindie Carlin POUR, DO;  [facility];  Service: Endoscopy;  Laterality: N/A;  200pm, asa 2   NM MYOCAR PERF WALL MOTION  04/2007   bruce myoview; normal pattern of perfusion to all regions; post-stress EF 70%; normal, low risk scan    Sleep Study  06/2004   mild OSA/hypopnea syndrome   TRANSTHORACIC ECHOCARDIOGRAM  04/2007   EF=>55%, mild asymmetric LVH; trace MR/TR    Family History  Problem Relation Age of Onset   Breast cancer Mother 25   Kidney disease Mother        kidney abscess   Diabetes Father        prostate cancer   Renal cancer Sister    Sickle cell trait Sister    Hypertension Brother    Healthy Son     Allergies as of 05/24/2024 - Review Complete 05/11/2024  Allergen Reaction Noted   Atorvastatin Other (See Comments) 01/14/2021   Penicillins Swelling and Rash 07/30/2012   Sulfa antibiotics Swelling and Rash 07/30/2012    Social History   Socioeconomic History   Marital status: Divorced    Spouse name: Not on file   Number of children: 1   Years of education: 12   Highest education level: Not on file  Occupational History   Occupation: Scientist, Research (medical): SELF-EMPLOYED  Tobacco Use   Smoking status: Never   Smokeless tobacco: Never  Vaping Use   Vaping status: Never Used  Substance and Sexual Activity   Alcohol use: Not Currently    Comment: occasional   Drug use: No   Sexual activity: Not Currently    Birth control/protection: Surgical    Comment: hyst  Other Topics Concern   Not on file  Social History Narrative   Not on file   Social Drivers of Health   Financial Resource Strain: Low  Risk  (05/11/2024)   Overall Financial Resource Strain (CARDIA)    Difficulty of Paying Living Expenses: Not very hard  Food Insecurity: No Food Insecurity (05/11/2024)   Hunger Vital Sign    Worried About Running Out of Food in the Last Year: Never true    Ran Out of Food in the Last Year: Never true  Transportation Needs: No Transportation Needs (05/11/2024)   PRAPARE - Administrator, Civil Service (Medical): No    Lack of Transportation (Non-Medical): No  Physical Activity: Insufficiently Active (05/11/2024)   Exercise Vital Sign    Days of Exercise per Week: 3 days    Minutes of Exercise per Session: 40 min  Stress: No Stress Concern  Present (05/11/2024)   [employer] of Occupational Health - Occupational Stress Questionnaire    Feeling of Stress: Not at all  Social Connections: Moderately Isolated (05/11/2024)   Social Connection and Isolation Panel    Frequency of Communication with Friends and Family: More than three times a week    Frequency of Social Gatherings with Friends and Family: Once a week    Attends Religious Services: More than 4 times per year    Active Member of [employer] or Organizations: No    Attends Banker Meetings: Never    Marital Status: Divorced    Review of Systems   Gen: Denies fever, chills, anorexia. Denies fatigue, weakness, weight loss.  CV: Denies chest pain, palpitations, syncope, peripheral edema, and claudication. Resp: Denies dyspnea at rest, cough, wheezing, coughing up blood, and pleurisy. GI: See HPI Derm: Denies rash, itching, dry skin Psych: Denies depression, anxiety, memory loss, confusion. No homicidal or suicidal ideation.  Heme: Denies bruising, bleeding, and enlarged lymph nodes.  Physical Exam   There were no vitals taken for this visit.  General:   Alert and oriented. No distress noted. Pleasant and cooperative.  Head:  Normocephalic and atraumatic. Eyes:  Conjuctiva clear without scleral  icterus. Mouth:  Oral mucosa pink and moist. Good dentition. No lesions. Lungs:  Clear to auscultation bilaterally. No wheezes, rales, or rhonchi. No distress.  Heart:  S1, S2 present without murmurs appreciated.  Abdomen:  +BS, soft, non-tender and non-distended. No rebound or guarding. No HSM or masses noted. Rectal: *** Msk:  Symmetrical without gross deformities. Normal posture. Extremities:  Without edema. Neurologic:  Alert and  oriented x4 Psych:  Alert and cooperative. Normal mood and affect.  Assessment & Plan  Toni Olsen is a 58 y.o. female presenting today with ***   Follow up   ***Follow up ***    Charmaine Melia, MSN, FNP-BC, AGACNP-BC [facility]

## 2024-05-29 ENCOUNTER — Other Ambulatory Visit: Payer: Self-pay | Admitting: Internal Medicine

## 2024-06-04 ENCOUNTER — Telehealth: Payer: Self-pay | Admitting: Adult Health

## 2024-06-04 MED ORDER — METRONIDAZOLE 0.75 % VA GEL: 1.0000 | Freq: Every day | VAGINAL | 1 refills | Status: AC

## 2024-06-04 NOTE — Telephone Encounter (Signed)
 Patient called stating that she is having some discharge and wants to know if you can send something in

## 2024-06-04 NOTE — Telephone Encounter (Addendum)
 Pt's discharge has come back. Pt was seen on 10/31 with BV. Pt did get better after treatment. Discharge came back last week. Will you send in med again? Thanks! JSY

## 2024-06-04 NOTE — Telephone Encounter (Signed)
 Refilled Metrogel 

## 2024-06-04 NOTE — Addendum Note (Signed)
 Addended by: Phyllis Abelson A on: 06/04/2024 01:35 PM   Modules accepted: Orders

## 2024-06-20 ENCOUNTER — Encounter: Payer: Self-pay | Admitting: Gastroenterology

## 2024-06-20 ENCOUNTER — Ambulatory Visit (INDEPENDENT_AMBULATORY_CARE_PROVIDER_SITE_OTHER): Payer: Self-pay | Admitting: Gastroenterology

## 2024-06-20 VITALS — BP 138/63 | HR 80 | Temp 97.7°F | Ht 64.0 in | Wt 204.8 lb

## 2024-06-20 DIAGNOSIS — K5900 Constipation, unspecified: Secondary | ICD-10-CM

## 2024-06-20 DIAGNOSIS — K219 Gastro-esophageal reflux disease without esophagitis: Secondary | ICD-10-CM

## 2024-06-20 DIAGNOSIS — B9681 Helicobacter pylori [H. pylori] as the cause of diseases classified elsewhere: Secondary | ICD-10-CM

## 2024-06-20 DIAGNOSIS — K439 Ventral hernia without obstruction or gangrene: Secondary | ICD-10-CM

## 2024-06-20 DIAGNOSIS — R6881 Early satiety: Secondary | ICD-10-CM

## 2024-06-20 DIAGNOSIS — R112 Nausea with vomiting, unspecified: Secondary | ICD-10-CM

## 2024-06-20 DIAGNOSIS — K5909 Other constipation: Secondary | ICD-10-CM

## 2024-06-20 DIAGNOSIS — A048 Other specified bacterial intestinal infections: Secondary | ICD-10-CM

## 2024-06-20 NOTE — Patient Instructions (Addendum)
 I want you to not take any Prilosec (omeprazole) for the next 2 weeks so we can repeat the H. pylori breath test to make sure that this infection is gone.  I also do not want you to take any antibiotics, Pepto, or Kaopectate within the next 2 weeks either.  For any reflux symptoms you may take famotidine 20 mg as needed but make sure you hold this for at least 24 hours prior to performing your breath test.  I would like for you to increase your ClearLax to twice daily take in the morning and before bed.  We will be scheduling you for a CT scan to further evaluate the soft area protrusion to your left lower abdomen for potential hernia.  For summary: Avoid omeprazole, antibiotics, and Pepto for 2 weeks and perform breath test on 12/23, 12/24 or just after christmas.  Do not resume omeprazole until you have done the breath test. ClearLax twice daily with at least 6-8 ounces of water  Add Metamucil (psyllium is generic) fiber 1 tablespoon daily or if you do capsules then it will be 2-3 once daily.  Continue adequate water  intake with at least 64-80 ounces daily If you are having any difficulty with hard stools then you may add a stool softener such as Colace (docusate sodium) once daily. CT scan of the abdomen and pelvis  I will see you for follow-up in about 8 weeks.  If you experience any worsening symptoms like abdominal pain, worsening constipation, etc. please call the office and let us  know.  I hope you have a Gertha Christmas and a happy new year!  It was a pleasure to see you today. I want to create trusting relationships with patients. If you receive a survey regarding your visit,  I greatly appreciate you taking time to fill this out on paper or through your MyChart. I value your feedback.  Charmaine Melia, MSN, FNP-BC, AGACNP-BC [facility]

## 2024-06-20 NOTE — Progress Notes (Signed)
 GI Office Note    Referring Provider: RAYNALDO DECK PUBLI* Primary Care Physician:  [facility] Primary Gastroenterologist: Carlin POUR. Cindie, DO  Date:  06/20/2024  ID:  Toni Olsen, DOB 30-May-1966, MRN [id number]  Chief Complaint   Chief Complaint  Patient presents with   Constipation    Severe constipation. Has a growth on left side of abdomen    History of Present Illness  Toni Olsen is a 58 y.o. female with a history of HTN, obesity, type 2 diabetes, and remote A-fib presenting today with complaint of   Colonoscopy July 2022: - Non- bleeding internal hemorrhoids.  - The entire examined colon is normal.  - No specimens collected. - Repeat in 10 years.   Last office visit October 2024 with Dr. Cindie.  Reported 4 months she began experiencing dysphagia and felt like food was sitting in her epigastric region and also had some recurrent vomiting which led to 2 ER visits.  At that time she had a CT scan of her abdomen pelvis which was unremarkable other than sigmoid diverticulosis.  Symptoms are having daily and having to cut her food into very small pieces and drink plenty of water  with each bite and denied any heartburn or acid reflux but did note some epigastric pain primarily after meals which was mild to moderate severity.  Started on omeprazole 20 mg once daily and scheduled for upper endoscopy with possible dilation.  EGD October 2024: - 3 cm hiatal hernia.  - Mild Schatzki ring. Dilated.  - Gastritis. Biopsied.  - Normal duodenal bulb, first portion of the duodenum and second portion of the duodenum. - Pathology revealed H. pylori gastritis and was treated with bismuth  triple therapy.  No documented eradication testing.  Today: Discussed the use of AI scribe software for clinical note transcription with the patient, who gave verbal consent to proceed.  She has been experiencing worsening constipation for at least six months, with a  notable increase in severity recently. She cannot recall her last really good bowel movement, and when she does have one, it has been painful, causing tears due to the discomfort. Her stools are described as dry and small, yet difficult to pass. She has tried various treatments over the last several months, including Miralax once daily and Benefiber with probiotics twice to three times daily, but these did not provide significant relief. Recently, she started using ClearLax every night, which has resulted in more regular bowel movements, though still not daily (about every other day).   She was diagnosed with a hernia (hiatal) last year and believes her gastrointestinal symptoms, including constipation and vomiting, have been problematic since then. She experiences vomiting episodes once or twice a month, often without a clear trigger, and associates these episodes with her constipation as well. She also reports early satiety and being unable to eat much, unable to eat full happy meal, since the onset of her constipation.  In October, she visited urgent care due to headaches and was diagnosed with a urinary tract infection and a sinus infection, for which she was prescribed antibiotics. However, the antibiotics made her feel very ill, and she had to switch medications, which continued to cause discomfort. During this time, she also experienced a vaginal discharge, despite having had a partial hysterectomy and not being sexually active. A gynecologist diagnosed her with a bacterial infection and was prescribed metrogel  and those symptoms have since resolved (last dose about 2 weeks ago).   She has  a history of partial hysterectomy, C-section, and gallbladder surgery. She stopped taking omeprazole when she became ill from antibiotics but last night did take a dose. She has not taken antibiotics for two weeks since her last treatment for bacterial vaginosis.  No current regular use of omeprazole. No excessive  gas but reports nausea and inability to pass gas during constipation episodes. No reports of melena or brbpr.       Wt Readings from Last 6 Encounters:  06/20/24 204 lb 12.8 oz (92.9 kg)  05/11/24 200 lb (90.7 kg)  04/21/23 202 lb 12.8 oz (92 kg)  04/12/23 197 lb (89.4 kg)  01/23/21 225 lb (102.1 kg)  12/11/20 229 lb 6.4 oz (104.1 kg)    Body mass index is 35.15 kg/m.   Current Outpatient Medications  Medication Sig Dispense Refill   acetaminophen (TYLENOL) 500 MG tablet Take 1,500 mg by mouth daily as needed for moderate pain or headache.     Menthol-Methyl Salicylate (MUSCLE RUB EX) Apply 1 application topically daily as needed (muscle pain).     omeprazole (PRILOSEC) 20 MG capsule Take 20 mg by mouth daily.     Omeprazole Magnesium (PRILOSEC PO) Take by mouth.     ondansetron  (ZOFRAN -ODT) 4 MG disintegrating tablet Take 1 tablet (4 mg total) by mouth every 8 (eight) hours as needed for nausea or vomiting. 20 tablet 0   Tetrahydrozoline HCl (VISINE OP) Place 1 drop into both eyes daily as needed (irritation).     bismuth  subsalicylate (PEPTO BISMOL) 262 MG chewable tablet Chew 524 mg by mouth as needed. (Patient not taking: Reported on 06/20/2024)     metroNIDAZOLE  (METROGEL ) 0.75 % vaginal gel Place 1 Applicatorful vaginally at bedtime. X 7 nights (Patient not taking: Reported on 06/20/2024) 70 g 1   Wheat Dextrin (BENEFIBER PO) Take by mouth. (Patient not taking: Reported on 06/20/2024)     No current facility-administered medications for this visit.    Past Medical History:  Diagnosis Date   Atrial fibrillation (HCC)    isolated - setting of decongestant & stimulant use   Diabetes mellitus without complication (HCC)    type 2   Hiatal hernia    Hypertension    Obesity     Past Surgical History:  Procedure Laterality Date   ABDOMINAL HYSTERECTOMY  1995   BALLOON DILATION N/A 05/03/2023   Procedure: BALLOON DILATION;  Surgeon: Cindie Carlin POUR, DO;  [facility];  Service: Endoscopy;  Laterality: N/A;   BIOPSY  05/03/2023   Procedure: BIOPSY;  Surgeon: Cindie Carlin POUR, DO;  [facility];  Service: Endoscopy;;   CESAREAN SECTION  1990   CHOLECYSTECTOMY  2002   COLONOSCOPY WITH PROPOFOL  N/A 01/26/2021   Procedure: COLONOSCOPY WITH PROPOFOL ;  Surgeon: Cindie Carlin POUR, DO;  [facility];  Service: Endoscopy;  Laterality: N/A;  ASA III / 10:30   ESOPHAGOGASTRODUODENOSCOPY (EGD) WITH PROPOFOL  N/A 05/03/2023   Procedure: ESOPHAGOGASTRODUODENOSCOPY (EGD) WITH PROPOFOL ;  Surgeon: Cindie Carlin POUR, DO;  [facility];  Service: Endoscopy;  Laterality: N/A;  200pm, asa 2   NM MYOCAR PERF WALL MOTION  04/2007   bruce myoview; normal pattern of perfusion to all regions; post-stress EF 70%; normal, low risk scan    Sleep Study  06/2004   mild OSA/hypopnea syndrome   TRANSTHORACIC ECHOCARDIOGRAM  04/2007   EF=>55%, mild asymmetric LVH; trace MR/TR    Family History  Problem Relation Age of Onset   Breast cancer  Mother 43   Kidney disease Mother        kidney abscess   Diabetes Father        prostate cancer   Renal cancer Sister    Sickle cell trait Sister    Hypertension Brother    Healthy Son     Allergies as of 06/20/2024 - Review Complete 06/20/2024  Allergen Reaction Noted   Atorvastatin Other (See Comments) 01/14/2021   Penicillins Swelling and Rash 07/30/2012   Sulfa antibiotics Swelling and Rash 07/30/2012    Social History   Socioeconomic History   Marital status: Divorced    Spouse name: Not on file   Number of children: 1   Years of education: 12   Highest education level: Not on file  Occupational History   Occupation: Scientist, Research (medical): SELF-EMPLOYED  Tobacco Use   Smoking status: Never   Smokeless tobacco: Never  Vaping Use   Vaping status: Never Used  Substance and Sexual Activity   Alcohol use: Not Currently    Comment: occasional   Drug use: No   Sexual activity: Not  Currently    Birth control/protection: Surgical    Comment: hyst  Other Topics Concern   Not on file  Social History Narrative   Not on file   Social Drivers of Health   Financial Resource Strain: Low Risk  (05/11/2024)   Overall Financial Resource Strain (CARDIA)    Difficulty of Paying Living Expenses: Not very hard  Food Insecurity: No Food Insecurity (05/11/2024)   Hunger Vital Sign    Worried About Running Out of Food in the Last Year: Never true    Ran Out of Food in the Last Year: Never true  Transportation Needs: No Transportation Needs (05/11/2024)   PRAPARE - Administrator, Civil Service (Medical): No    Lack of Transportation (Non-Medical): No  Physical Activity: Insufficiently Active (05/11/2024)   Exercise Vital Sign    Days of Exercise per Week: 3 days    Minutes of Exercise per Session: 40 min  Stress: No Stress Concern Present (05/11/2024)   [employer] of Occupational Health - Occupational Stress Questionnaire    Feeling of Stress: Not at all  Social Connections: Moderately Isolated (05/11/2024)   Social Connection and Isolation Panel    Frequency of Communication with Friends and Family: More than three times a week    Frequency of Social Gatherings with Friends and Family: Once a week    Attends Religious Services: More than 4 times per year    Active Member of [employer] or Organizations: No    Attends Banker Meetings: Never    Marital Status: Divorced    Review of Systems   Gen: Denies fever, chills, anorexia. Denies fatigue, weakness, weight loss.  CV: Denies chest pain, palpitations, syncope, peripheral edema, and claudication. Resp: Denies dyspnea at rest, cough, wheezing, coughing up blood, and pleurisy. GI: See HPI Derm: Denies rash, itching, dry skin Psych: Denies depression, anxiety, memory loss, confusion. No homicidal or suicidal ideation.  Heme: Denies bruising, bleeding, and enlarged lymph nodes.  Physical Exam    BP 138/63 (BP Location: Right Arm, Patient Position: Sitting, Cuff Size: Large)   Pulse 80   Temp 97.7 F (36.5 C) (Temporal)   Ht 5' 4 (1.626 m)   Wt 204 lb 12.8 oz (92.9 kg)   BMI 35.15 kg/m   General:   Alert and oriented. No distress noted. Pleasant and cooperative.  Head:  Normocephalic and atraumatic. Eyes:  Conjuctiva clear without scleral icterus. Mouth:  Oral mucosa pink and moist. Good dentition. No lesions. Abdomen:  +BS, soft, non-tender and non-distended.  Symmetrical lower abdomen but with soft void felt to left lower quadrant without any palpable mass or nodule. No rebound or guarding. No HSM or masses noted. Rectal: deferred Msk:  Symmetrical without gross deformities. Normal posture. Extremities:  Without edema. Neurologic:  Alert and  oriented x4 Psych:  Alert and cooperative. Normal mood and affect.  Assessment & Plan  SHEMEKA WARDLE is a 58 y.o. female presenting today to discuss constipation and left lower quadrant bulge (concern for hernia).     Chronic constipation Worsening symptoms, including infrequent bowel movements, hard stools, and associated nausea and fullness. Stools bristol 1. Symptoms have been present intermittently but have worsened recently. This is not a significant change in her normal bowel habits but is an exacerbation. ClearLax has been somewhat effective in improving bowel movements. Last colonoscopy normal in 2022. Abdominal exam with likely LLQ wall hernia and no evidence of mass. No melena, brbpr, or rectal pain without hard BM. Low threshold to perform repeat colonoscopy, however.  - Increase ClearLax to twice daily. - Consider adding psyllium fiber supplement once daily. - Ensure adequate hydration and fiber intake in diet.  - If constipation worsens despite twice daily Clearlax then advised addition of stool softener.   Abdominal wall hernia Small abdominal wall hernia, about width of a fist present to LLQ with soft tissue  protrusion, no palpable bowel involvement and no tenderness of erythema. Constipation may exacerbate symptoms by increasing intra-abdominal pressure. - Ordered CT scan to evaluate possible hernia. - Monitor for signs of bowel involvement or worsening symptoms.  Helicobacter pylori infection Previous treatment for H. pylori infection with persistent symptoms of nausea and vomiting. Potential for lingering infection due to strain variability. Retesting is planned after a four-week antibiotic-free period. - Will perform H. pylori breath test after four-week antibiotic-free period and two-week PPI free.  - Avoid antibiotics, Pepto Bismol, and acid reflux medications prior to testing. - Will retreat if H. pylori is still present. - Discussed use of famotidine for reflux up until l24 hours prior to breath test for symptom relief.   Gastroesophageal reflux disease, nausea and vomiting, early satiety GERD with previous use of omeprazole. Likely exacerbated by prior H. Pylori infection. Symptoms of nausea and vomiting may be exacerbated by constipation and potential H. pylori infection. Alternative management with famotidine is considered up to 24 hours prior to breath test.  - Avoid omeprazole and other acid reflux medications for two weeks prior to H. pylori testing. - Consider using famotidine as above until breath test completed.  - GERD diet      Follow up   Follow up 8 weeks   Charmaine Melia, MSN, FNP-BC, AGACNP-BC [facility]

## 2024-06-27 ENCOUNTER — Encounter: Payer: Self-pay | Admitting: Urology

## 2024-06-27 ENCOUNTER — Ambulatory Visit: Payer: Self-pay | Admitting: Urology

## 2024-06-27 ENCOUNTER — Ambulatory Visit (HOSPITAL_BASED_OUTPATIENT_CLINIC_OR_DEPARTMENT_OTHER): Admission: RE | Admit: 2024-06-27 | Discharge: 2024-06-27 | Payer: Self-pay | Attending: Gastroenterology

## 2024-06-27 VITALS — BP 129/85 | HR 99

## 2024-06-27 DIAGNOSIS — R339 Retention of urine, unspecified: Secondary | ICD-10-CM

## 2024-06-27 DIAGNOSIS — N3000 Acute cystitis without hematuria: Secondary | ICD-10-CM

## 2024-06-27 DIAGNOSIS — Z87440 Personal history of urinary (tract) infections: Secondary | ICD-10-CM

## 2024-06-27 DIAGNOSIS — K5900 Constipation, unspecified: Secondary | ICD-10-CM | POA: Insufficient documentation

## 2024-06-27 LAB — POCT I-STAT CREATININE: Creatinine, Ser: 0.9 mg/dL (ref 0.44–1.00)

## 2024-06-27 LAB — BLADDER SCAN AMB NON-IMAGING: Scan Result: 60

## 2024-06-27 MED ORDER — IOHEXOL 300 MG/ML  SOLN
100.0000 mL | Freq: Once | INTRAMUSCULAR | Status: AC | PRN
  Administered 2024-06-27: 17:00:00 100 mL via INTRAVENOUS

## 2024-06-27 NOTE — Patient Instructions (Signed)

## 2024-06-27 NOTE — Progress Notes (Signed)
° °  Patient cannot void prior to the bladder scan. Bladder scan result: 60  Performed By: [facility] LPN

## 2024-06-27 NOTE — Progress Notes (Signed)
 06/27/2024 2:00 PM   [name] [id number] [id number]  Referring provider: [facility] [address] [place],  [place] [address]  UTI and difficulty urinating   HPI: [name] is a 58yo here for evaluation of UTI and difficulty urinating. In October 2025 she had a UTI which was treated by urgent care. She was having urinary urgency and difficulty initiating her urine stream. She has issues with constipation which has been exacerbating her urinary frequency and urgency. She is currently on a clearlax which is helping her have a daily bowel movement. Her LUTS have resolved. She drinks 64oz of water  daily. No hematuria or dysuria. No history of frequent UTI.    PMH: Past Medical History:  Diagnosis Date   Atrial fibrillation (HCC)    isolated - setting of decongestant & stimulant use   Diabetes mellitus without complication (HCC)    type 2   Hiatal hernia    Hypertension    Obesity     Surgical History: Past Surgical History:  Procedure Laterality Date   ABDOMINAL HYSTERECTOMY  1995   BALLOON DILATION N/A 05/03/2023   Procedure: BALLOON DILATION;  Surgeon: Cindie Carlin POUR, DO;  [facility];  Service: Endoscopy;  Laterality: N/A;   BIOPSY  05/03/2023   Procedure: BIOPSY;  Surgeon: Cindie Carlin POUR, DO;  Location: [facility];  Service: Endoscopy;;   CESAREAN SECTION  1990   CHOLECYSTECTOMY  2002   COLONOSCOPY WITH PROPOFOL  N/A 01/26/2021   Procedure: COLONOSCOPY WITH PROPOFOL ;  Surgeon: Cindie Carlin POUR, DO;  [facility]: [facility];  Service: Endoscopy;  Laterality: N/A;  ASA III / 10:30   ESOPHAGOGASTRODUODENOSCOPY (EGD) WITH PROPOFOL  N/A 05/03/2023   Procedure: ESOPHAGOGASTRODUODENOSCOPY (EGD) WITH PROPOFOL ;  Surgeon: Cindie Carlin POUR, DO;  Location: [facility];  Service: Endoscopy;  Laterality: N/A;  200pm, asa 2   NM MYOCAR PERF WALL MOTION  04/2007   bruce myoview; normal pattern of perfusion to all regions; post-stress EF 70%;  normal, low risk scan    Sleep Study  06/2004   mild OSA/hypopnea syndrome   TRANSTHORACIC ECHOCARDIOGRAM  04/2007   EF=>55%, mild asymmetric LVH; trace MR/TR    Home Medications:  Allergies as of 06/27/2024       Reactions   Atorvastatin Other (See Comments)   Muscle pain   Penicillins Swelling, Rash   Sulfa Antibiotics Swelling, Rash        Medication List        Accurate as of June 27, 2024  2:00 PM. If you have any questions, ask your nurse or doctor.          acetaminophen 500 MG tablet Commonly known as: TYLENOL Take 1,500 mg by mouth daily as needed for moderate pain or headache.   BENEFIBER PO Take by mouth.   bismuth  subsalicylate 262 MG chewable tablet Commonly known as: PEPTO BISMOL Chew 524 mg by mouth as needed.   metroNIDAZOLE  0.75 % vaginal gel Commonly known as: METROGEL  Place 1 Applicatorful vaginally at bedtime. X 7 nights   MUSCLE RUB EX Apply 1 application topically daily as needed (muscle pain).   omeprazole 20 MG capsule Commonly known as: PRILOSEC Take 20 mg by mouth daily.   ondansetron  4 MG disintegrating tablet Commonly known as: ZOFRAN -ODT Take 1 tablet (4 mg total) by mouth every 8 (eight) hours as needed for nausea or vomiting.   PRILOSEC PO Take by mouth.   VISINE OP Place 1 drop into both  eyes daily as needed (irritation).        Allergies: Allergies[1]  Family History: Family History  Problem Relation Age of Onset   Breast cancer Mother 72   Kidney disease Mother        kidney abscess   Diabetes Father        prostate cancer   Renal cancer Sister    Sickle cell trait Sister    Hypertension Brother    Healthy Son     Social History:  reports that she has never smoked. She has never used smokeless tobacco. She reports that she does not currently use alcohol. She reports that she does not use drugs.  ROS: All other review of systems were reviewed and are negative except what is noted above in  HPI  Physical Exam: BP 129/85   Pulse 99   Constitutional:  Alert and oriented, No acute distress. HEENT: [place] AT, moist mucus membranes.  Trachea midline, no masses. Cardiovascular: No clubbing, cyanosis, or edema. Respiratory: Normal respiratory effort, no increased work of breathing. GI: Abdomen is soft, nontender, nondistended, no abdominal masses GU: No CVA tenderness.  Lymph: No cervical or inguinal lymphadenopathy. Skin: No rashes, bruises or suspicious lesions. Neurologic: Grossly intact, no focal deficits, moving all 4 extremities. Psychiatric: Normal mood and affect.  Laboratory Data: Lab Results  Component Value Date   WBC 7.9 04/12/2023   HGB 16.0 (H) 04/12/2023   HCT 48.3 (H) 04/12/2023   MCV 80.5 04/12/2023   PLT 368 04/12/2023    Lab Results  Component Value Date   CREATININE 0.72 04/12/2023    No results found for: PSA  No results found for: TESTOSTERONE  Lab Results  Component Value Date   HGBA1C 11.0 (H) 01/25/2018    Urinalysis    Component Value Date/Time   COLORURINE YELLOW 02/27/2016 1457   APPEARANCEUR CLEAR 02/27/2016 1457   LABSPEC 1.018 02/27/2016 1457   PHURINE 5.5 02/27/2016 1457   GLUCOSEU 1+ (A) 02/27/2016 1457   HGBUR TRACE (A) 02/27/2016 1457   BILIRUBINUR NEGATIVE 02/27/2016 1457   KETONESUR NEGATIVE 02/27/2016 1457   PROTEINUR NEGATIVE 02/27/2016 1457   UROBILINOGEN 0.2 12/26/2013 0925   NITRITE POSITIVE (A) 02/27/2016 1457   LEUKOCYTESUR 1+ (A) 02/27/2016 1457    Lab Results  Component Value Date   BACTERIA FEW (A) 02/27/2016    Pertinent Imaging:  No results found for this or any previous visit.  No results found for this or any previous visit.  No results found for this or any previous visit.  No results found for this or any previous visit.  No results found for this or any previous visit.  No results found for this or any previous visit.  No results found for this or any previous visit.  No results  found for this or any previous visit.   Assessment & Plan:    1. Incomplete bladder emptying (Primary) -resolved - Urinalysis, Routine w reflex microscopic - BLADDER SCAN AMB NON-IMAGING  2. Acute cystitis without hematuria Resolved. Followup prn   No follow-ups on file.  Belvie Clara, MD  [facility] [place]      [1]  Allergies Allergen Reactions   Atorvastatin Other (See Comments)    Muscle pain   Penicillins Swelling and Rash   Sulfa Antibiotics Swelling and Rash

## 2024-07-04 ENCOUNTER — Ambulatory Visit: Payer: Self-pay | Admitting: Gastroenterology

## 2024-07-16 ENCOUNTER — Ambulatory Visit: Payer: Self-pay | Admitting: Gastroenterology

## 2024-10-01 ENCOUNTER — Ambulatory Visit: Payer: Self-pay | Admitting: Gastroenterology
